# Patient Record
Sex: Female | Born: 1970 | Race: Black or African American | Hispanic: No | Marital: Married | Smoking: Never smoker
Health system: Southern US, Community
[De-identification: ages and names within clinical notes are randomized; demographics above are authoritative.]

## PROBLEM LIST (undated history)

## (undated) DIAGNOSIS — L219 Seborrheic dermatitis, unspecified: Secondary | ICD-10-CM

## (undated) DIAGNOSIS — J301 Allergic rhinitis due to pollen: Secondary | ICD-10-CM

## (undated) DIAGNOSIS — I10 Essential (primary) hypertension: Secondary | ICD-10-CM

## (undated) DIAGNOSIS — E785 Hyperlipidemia, unspecified: Secondary | ICD-10-CM

## (undated) DIAGNOSIS — R768 Other specified abnormal immunological findings in serum: Secondary | ICD-10-CM

## (undated) DIAGNOSIS — O24419 Gestational diabetes mellitus in pregnancy, unspecified control: Secondary | ICD-10-CM

## (undated) HISTORY — PX: UTERINE FIBROID SURGERY: SHX826

## (undated) HISTORY — PX: PILONIDAL CYST EXCISION: SHX744

## (undated) HISTORY — DX: Other specified abnormal immunological findings in serum: R76.8

## (undated) HISTORY — DX: Hyperlipidemia, unspecified: E78.5

## (undated) HISTORY — DX: Allergic rhinitis due to pollen: J30.1

## (undated) HISTORY — DX: Essential (primary) hypertension: I10

## (undated) HISTORY — PX: OTHER SURGICAL HISTORY: SHX169

## (undated) HISTORY — DX: Seborrheic dermatitis, unspecified: L21.9

## (undated) HISTORY — DX: Gestational diabetes mellitus in pregnancy, unspecified control: O24.419

---

## 2002-08-11 ENCOUNTER — Other Ambulatory Visit: Admission: RE | Admit: 2002-08-11 | Discharge: 2002-08-11 | Payer: Self-pay | Admitting: Obstetrics and Gynecology

## 2003-08-03 ENCOUNTER — Encounter: Payer: Self-pay | Admitting: Family Medicine

## 2003-08-03 LAB — CONVERTED CEMR LAB

## 2003-09-04 ENCOUNTER — Ambulatory Visit (HOSPITAL_BASED_OUTPATIENT_CLINIC_OR_DEPARTMENT_OTHER): Admission: RE | Admit: 2003-09-04 | Discharge: 2003-09-04 | Payer: Self-pay | Admitting: *Deleted

## 2003-09-04 ENCOUNTER — Ambulatory Visit (HOSPITAL_COMMUNITY): Admission: RE | Admit: 2003-09-04 | Discharge: 2003-09-04 | Payer: Self-pay | Admitting: *Deleted

## 2003-09-04 ENCOUNTER — Encounter (INDEPENDENT_AMBULATORY_CARE_PROVIDER_SITE_OTHER): Payer: Self-pay | Admitting: Specialist

## 2003-10-01 ENCOUNTER — Other Ambulatory Visit: Admission: RE | Admit: 2003-10-01 | Discharge: 2003-10-01 | Payer: Self-pay | Admitting: Obstetrics and Gynecology

## 2004-01-14 ENCOUNTER — Ambulatory Visit: Payer: Self-pay | Admitting: Family Medicine

## 2004-01-29 ENCOUNTER — Encounter: Admission: RE | Admit: 2004-01-29 | Discharge: 2004-01-29 | Payer: Self-pay | Admitting: Family Medicine

## 2004-02-06 ENCOUNTER — Ambulatory Visit: Payer: Self-pay | Admitting: Family Medicine

## 2004-05-15 ENCOUNTER — Ambulatory Visit: Payer: Self-pay | Admitting: Family Medicine

## 2004-10-15 ENCOUNTER — Ambulatory Visit: Payer: Self-pay | Admitting: Family Medicine

## 2004-10-24 ENCOUNTER — Ambulatory Visit: Payer: Self-pay | Admitting: Family Medicine

## 2004-10-28 ENCOUNTER — Encounter: Admission: RE | Admit: 2004-10-28 | Discharge: 2005-01-26 | Payer: Self-pay | Admitting: Family Medicine

## 2004-10-29 ENCOUNTER — Ambulatory Visit: Payer: Self-pay | Admitting: Family Medicine

## 2004-12-05 ENCOUNTER — Ambulatory Visit: Payer: Self-pay | Admitting: Family Medicine

## 2004-12-23 ENCOUNTER — Inpatient Hospital Stay (HOSPITAL_COMMUNITY): Admission: AD | Admit: 2004-12-23 | Discharge: 2004-12-23 | Payer: Self-pay | Admitting: Obstetrics and Gynecology

## 2005-01-13 ENCOUNTER — Other Ambulatory Visit: Admission: RE | Admit: 2005-01-13 | Discharge: 2005-01-13 | Payer: Self-pay | Admitting: Obstetrics and Gynecology

## 2005-02-25 ENCOUNTER — Ambulatory Visit: Payer: Self-pay | Admitting: Internal Medicine

## 2005-04-09 ENCOUNTER — Ambulatory Visit: Payer: Self-pay | Admitting: Internal Medicine

## 2005-06-12 ENCOUNTER — Ambulatory Visit: Payer: Self-pay | Admitting: Family Medicine

## 2005-06-30 ENCOUNTER — Encounter: Admission: RE | Admit: 2005-06-30 | Discharge: 2005-06-30 | Payer: Self-pay | Admitting: Obstetrics and Gynecology

## 2005-08-14 ENCOUNTER — Inpatient Hospital Stay (HOSPITAL_COMMUNITY): Admission: AD | Admit: 2005-08-14 | Discharge: 2005-08-14 | Payer: Self-pay | Admitting: Obstetrics and Gynecology

## 2005-08-21 ENCOUNTER — Inpatient Hospital Stay (HOSPITAL_COMMUNITY): Admission: RE | Admit: 2005-08-21 | Discharge: 2005-08-25 | Payer: Self-pay | Admitting: Obstetrics & Gynecology

## 2005-08-21 DIAGNOSIS — O24419 Gestational diabetes mellitus in pregnancy, unspecified control: Secondary | ICD-10-CM

## 2005-11-03 ENCOUNTER — Ambulatory Visit: Payer: Self-pay | Admitting: Family Medicine

## 2006-01-21 ENCOUNTER — Ambulatory Visit: Payer: Self-pay | Admitting: Family Medicine

## 2006-10-01 ENCOUNTER — Ambulatory Visit: Payer: Self-pay | Admitting: Family Medicine

## 2006-10-01 ENCOUNTER — Telehealth (INDEPENDENT_AMBULATORY_CARE_PROVIDER_SITE_OTHER): Payer: Self-pay | Admitting: *Deleted

## 2006-10-01 DIAGNOSIS — J329 Chronic sinusitis, unspecified: Secondary | ICD-10-CM

## 2006-10-01 DIAGNOSIS — J309 Allergic rhinitis, unspecified: Secondary | ICD-10-CM | POA: Insufficient documentation

## 2006-10-01 DIAGNOSIS — E785 Hyperlipidemia, unspecified: Secondary | ICD-10-CM

## 2006-10-01 DIAGNOSIS — L659 Nonscarring hair loss, unspecified: Secondary | ICD-10-CM | POA: Insufficient documentation

## 2006-12-16 ENCOUNTER — Encounter: Admission: RE | Admit: 2006-12-16 | Discharge: 2006-12-16 | Payer: Self-pay | Admitting: Obstetrics and Gynecology

## 2007-02-25 ENCOUNTER — Ambulatory Visit: Payer: Self-pay | Admitting: Family Medicine

## 2007-03-05 ENCOUNTER — Ambulatory Visit: Payer: Self-pay | Admitting: Family Medicine

## 2007-08-30 ENCOUNTER — Inpatient Hospital Stay (HOSPITAL_COMMUNITY): Admission: AD | Admit: 2007-08-30 | Discharge: 2007-08-30 | Payer: Self-pay | Admitting: Obstetrics and Gynecology

## 2007-09-15 ENCOUNTER — Inpatient Hospital Stay (HOSPITAL_COMMUNITY): Admission: RE | Admit: 2007-09-15 | Discharge: 2007-09-18 | Payer: Self-pay | Admitting: Obstetrics and Gynecology

## 2007-09-15 DIAGNOSIS — O24419 Gestational diabetes mellitus in pregnancy, unspecified control: Secondary | ICD-10-CM

## 2007-11-01 ENCOUNTER — Ambulatory Visit: Payer: Self-pay | Admitting: Family Medicine

## 2007-11-28 ENCOUNTER — Ambulatory Visit: Payer: Self-pay | Admitting: Family Medicine

## 2008-02-12 ENCOUNTER — Emergency Department (HOSPITAL_COMMUNITY): Admission: EM | Admit: 2008-02-12 | Discharge: 2008-02-13 | Payer: Self-pay | Admitting: Emergency Medicine

## 2008-02-12 ENCOUNTER — Encounter: Payer: Self-pay | Admitting: Family Medicine

## 2008-02-15 ENCOUNTER — Ambulatory Visit: Payer: Self-pay | Admitting: Family Medicine

## 2008-02-15 ENCOUNTER — Telehealth (INDEPENDENT_AMBULATORY_CARE_PROVIDER_SITE_OTHER): Payer: Self-pay | Admitting: *Deleted

## 2008-02-15 DIAGNOSIS — R739 Hyperglycemia, unspecified: Secondary | ICD-10-CM

## 2008-02-15 DIAGNOSIS — Z8742 Personal history of other diseases of the female genital tract: Secondary | ICD-10-CM

## 2008-02-15 LAB — CONVERTED CEMR LAB
Bilirubin Urine: NEGATIVE
Glucose, Urine, Semiquant: NEGATIVE
Ketones, urine, test strip: NEGATIVE
Nitrite: NEGATIVE
Specific Gravity, Urine: <1.005
Urobilinogen, UA: 0.2
pH: 7

## 2008-02-16 ENCOUNTER — Encounter: Payer: Self-pay | Admitting: Family Medicine

## 2008-02-16 LAB — CONVERTED CEMR LAB
Glucose, Bld: 84 mg/dL (ref 70–99)
Hgb A1c MFr Bld: 5.8 % (ref 4.6–6.0)

## 2008-03-19 ENCOUNTER — Telehealth: Payer: Self-pay | Admitting: Family Medicine

## 2008-03-21 ENCOUNTER — Ambulatory Visit: Payer: Self-pay | Admitting: Family Medicine

## 2008-04-25 ENCOUNTER — Ambulatory Visit: Payer: Self-pay | Admitting: Family Medicine

## 2008-06-06 ENCOUNTER — Ambulatory Visit: Payer: Self-pay | Admitting: Family Medicine

## 2008-06-22 ENCOUNTER — Ambulatory Visit: Payer: Self-pay | Admitting: Family Medicine

## 2008-06-22 DIAGNOSIS — G43009 Migraine without aura, not intractable, without status migrainosus: Secondary | ICD-10-CM | POA: Insufficient documentation

## 2008-12-10 ENCOUNTER — Ambulatory Visit: Payer: Self-pay | Admitting: Family Medicine

## 2009-01-23 ENCOUNTER — Ambulatory Visit: Payer: Self-pay | Admitting: Family Medicine

## 2009-01-28 LAB — CONVERTED CEMR LAB
ALT: 10 units/L (ref 0–35)
AST: 17 units/L (ref 0–37)
Albumin: 3.8 g/dL (ref 3.5–5.2)
Alkaline Phosphatase: 57 units/L (ref 39–117)
BUN: 10 mg/dL (ref 6–23)
Basophils Absolute: 0 10*3/uL (ref 0.0–0.1)
Basophils Relative: 0.3 % (ref 0.0–3.0)
Bilirubin, Direct: 0 mg/dL (ref 0.0–0.3)
CO2: 26 meq/L (ref 19–32)
Calcium: 8.1 mg/dL — ABNORMAL LOW (ref 8.4–10.5)
Chloride: 104 meq/L (ref 96–112)
Cholesterol: 166 mg/dL (ref 0–200)
Creatinine, Ser: 0.8 mg/dL (ref 0.4–1.2)
Eosinophils Absolute: 0.2 10*3/uL (ref 0.0–0.7)
Eosinophils Relative: 3.2 % (ref 0.0–5.0)
GFR calc non Af Amer: 102.2 mL/min (ref >60)
Glucose, Bld: 107 mg/dL — ABNORMAL HIGH (ref 70–99)
HCT: 38.4 % (ref 36.0–46.0)
HDL: 51 mg/dL (ref >39.00)
Hemoglobin: 12.9 g/dL (ref 12.0–15.0)
LDL Cholesterol: 99 mg/dL (ref 0–99)
Lymphocytes Relative: 23.7 % (ref 12.0–46.0)
Lymphs Abs: 1.1 10*3/uL (ref 0.7–4.0)
MCHC: 33.4 g/dL (ref 30.0–36.0)
MCV: 80.9 fL (ref 78.0–100.0)
Monocytes Absolute: 0.6 10*3/uL (ref 0.1–1.0)
Monocytes Relative: 13.4 % — ABNORMAL HIGH (ref 3.0–12.0)
Neutro Abs: 2.8 10*3/uL (ref 1.4–7.7)
Neutrophils Relative %: 59.4 % (ref 43.0–77.0)
Platelets: 202 10*3/uL (ref 150.0–400.0)
Potassium: 3.6 meq/L (ref 3.5–5.1)
RBC: 4.76 M/uL (ref 3.87–5.11)
RDW: 14 % (ref 11.5–14.6)
Sodium: 137 meq/L (ref 135–145)
TSH: 0.63 microintl units/mL (ref 0.35–5.50)
Total Bilirubin: 0.8 mg/dL (ref 0.3–1.2)
Total CHOL/HDL Ratio: 3
Total Protein: 7.1 g/dL (ref 6.0–8.3)
Triglycerides: 82 mg/dL (ref 0.0–149.0)
VLDL: 16.4 mg/dL (ref 0.0–40.0)
WBC: 4.7 10*3/uL (ref 4.5–10.5)

## 2009-02-22 ENCOUNTER — Telehealth: Payer: Self-pay | Admitting: Family Medicine

## 2009-03-25 ENCOUNTER — Ambulatory Visit: Payer: Self-pay | Admitting: Family Medicine

## 2009-03-29 LAB — CONVERTED CEMR LAB
Calcium, Total (PTH): 8.8 mg/dL (ref 8.4–10.5)
PTH: 38.3 pg/mL (ref 14.0–72.0)

## 2009-05-13 ENCOUNTER — Telehealth: Payer: Self-pay | Admitting: Family Medicine

## 2009-05-13 ENCOUNTER — Ambulatory Visit: Payer: Self-pay | Admitting: Family Medicine

## 2009-05-28 ENCOUNTER — Telehealth: Payer: Self-pay | Admitting: Family Medicine

## 2009-07-02 ENCOUNTER — Ambulatory Visit: Payer: Self-pay | Admitting: Family Medicine

## 2009-07-02 LAB — CONVERTED CEMR LAB: Rapid Strep: NEGATIVE

## 2009-07-03 ENCOUNTER — Telehealth: Payer: Self-pay | Admitting: Family Medicine

## 2009-07-15 ENCOUNTER — Telehealth: Payer: Self-pay | Admitting: Family Medicine

## 2009-10-03 ENCOUNTER — Ambulatory Visit: Payer: Self-pay | Admitting: Family Medicine

## 2010-01-02 ENCOUNTER — Encounter: Admission: RE | Admit: 2010-01-02 | Discharge: 2010-01-02 | Payer: Self-pay | Admitting: Obstetrics and Gynecology

## 2010-01-16 ENCOUNTER — Ambulatory Visit: Payer: Self-pay | Admitting: Internal Medicine

## 2010-01-28 ENCOUNTER — Ambulatory Visit: Payer: Self-pay | Admitting: Family Medicine

## 2010-01-30 ENCOUNTER — Ambulatory Visit: Payer: Self-pay | Admitting: Family Medicine

## 2010-02-19 ENCOUNTER — Encounter: Payer: Self-pay | Admitting: Family Medicine

## 2010-02-23 ENCOUNTER — Encounter: Payer: Self-pay | Admitting: Obstetrics and Gynecology

## 2010-03-03 ENCOUNTER — Ambulatory Visit
Admission: RE | Admit: 2010-03-03 | Discharge: 2010-03-03 | Payer: Self-pay | Source: Home / Self Care | Attending: Family Medicine | Admitting: Family Medicine

## 2010-03-03 DIAGNOSIS — M6740 Ganglion, unspecified site: Secondary | ICD-10-CM | POA: Insufficient documentation

## 2010-03-04 NOTE — Assessment & Plan Note (Signed)
Summary: SORE THROAT   Vital Signs:  Patient profile:   40 year old female Height:      63 inches Weight:      150.50 pounds BMI:     26.76 Temp:     98.7 degrees F oral Pulse rate:   84 / minute Pulse rhythm:   regular BP sitting:   142 / 90  (left arm) Cuff size:   regular  Vitals Entered By: Delilah Shan CMA Duncan Dull) (Jul 02, 2009 2:29 PM) CC: ST   History of Present Illness: starting to get sick on saturday is painful in ears - with severe sore throat also some hoarseness  taking some aleve otc for pain  still hurts to swallow  has not looked at it   100.0 fever this am feeling fatigue and some chills  no rash  no tick bites   some runny and stuffy nose and cough  yellow sinus drainage with blood in it  ? if sinus infx again- has them chronically  not too much sinus pressure   had some nausea last night - that is improve --phenergan worked  no vomiting  no chance pregnant   Allergies: 1)  ! Imitrex Statdose System (Sumatriptan Succinate)  Past History:  Past Medical History: Last updated: 06/08/09 Allergic rhinitis Hyperlipidemia gestational DM  ? pos hep B test  migraine   Past Surgical History: Last updated: 10/01/2006 Pilonidal cyst (02/2003),  surgery (09/2003) Uterine fibroids Hystosalpingogram- neg 07-09-2004)  Family History: Last updated: 06/08/2009 Father: DM  Mother: died of liver cancer 07-09-2008 Siblings: 3 sisters sister - headaches   Social History: Last updated: 02/15/2008 Marital Status: Married Children:  Occupation:  children- 2   Risk Factors: Smoking Status: never (10/01/2006)  Review of Systems General:  Complains of chills, fatigue, and fever. Eyes:  Denies blurring and discharge. CV:  Denies chest pain or discomfort and lightheadness. Resp:  Complains of cough and sputum productive; denies pleuritic and shortness of breath. GI:  Denies abdominal pain, indigestion, nausea, and vomiting. Derm:  Denies itching and  rash. Allergy:  Complains of seasonal allergies.  Physical Exam  General:  Well-developed,well-nourished,in no acute distress; alert,appropriate and cooperative throughout examination Head:  normocephalic, atraumatic, and no abnormalities observed.  no sinus tenderness  Eyes:  vision grossly intact, pupils equal, pupils round, pupils reactive to light, and no injection.   Ears:  R ear normal and L ear normal.   Nose:  nares are injected and congested bilaterally  Mouth:  pharynx pink and moist.  throat is mildly injected with clear post nasal drip no swelling or exudate  Neck:  No deformities, masses, or tenderness noted. Lungs:  Normal respiratory effort, chest expands symmetrically. Lungs are clear to auscultation, no crackles or wheezes. Heart:  Normal rate and regular rhythm. S1 and S2 normal without gallop, murmur, click, rub or other extra sounds. Skin:  Intact without suspicious lesions or rashes Cervical Nodes:  No lymphadenopathy noted Psych:  normal affect, talkative and pleasant    Impression & Recommendations:  Problem # 1:  URI (ICD-465.9) Assessment New  with sore throat and congestion and neg rapid strep  recommend sympt care- see pt instructions   in light of hx of chronic sinusitis - px for augmentin to hold in case she worsens or does not improve in several days  pt advised to update me if symptoms worsen or do not improve -- or if worse st Her updated medication list for this problem includes:  Promethazine Hcl 25 Mg Tabs (Promethazine hcl) .Marland Kitchen... 1 by mouth up to three times a day as needed nausea /vomiting  caution of sedation    Tylenol Allergy Sinus 2-30-500 Mg Tabs (Chlorphen-pseudoephed-apap) ..... Otc as directed.  Orders: Rapid Strep ([address])  Complete Medication List: 1)  Flonase 50 Mcg/act Susp (Fluticasone propionate) .... 2 sprays in each nostril daily as needed 2)  Promethazine Hcl 25 Mg Tabs (Promethazine hcl) .Marland Kitchen.. 1 by mouth up to three times a  day as needed nausea /vomiting  caution of sedation 3)  Tylenol Allergy Sinus 2-30-500 Mg Tabs (Chlorphen-pseudoephed-apap) .... Otc as directed. 4)  Augmentin 875-125 Mg Tabs (Amoxicillin-pot clavulanate) .Marland Kitchen.. 1 by mouth two times a day for 10 days  Patient Instructions: 1)  rapid strep test is negative  2)  I think you have upper resp virus  3)  drink lots of fluids  4)  try some zyrtec over the counter 10mg  daily - to slow down runny nose and drip  5)  naproxen is ok for throat pain and fever - but take it with food  6)  chloraseptic throat spray helps too  7)  if worse sore throat or sinus pain- fill the px for augmentin 8)  if not improving in5-7 days - please update me (or if you get worse )  Prescriptions: AUGMENTIN 875-125 MG TABS (AMOXICILLIN-POT CLAVULANATE) 1 by mouth two times a day for 10 days  #20 x 0   Entered and Authorized by:   Judith Part MD   Signed by:   Judith Part MD on 07/02/2009   Method used:   Print then Give to Patient   RxID:   [id number]   Current Allergies (reviewed today): ! IMITREX STATDOSE SYSTEM (SUMATRIPTAN SUCCINATE)  Laboratory Results  Date/Time Received: Jul 02, 2009 2:32 PM   Other Tests  Rapid Strep: negative

## 2010-03-04 NOTE — Progress Notes (Signed)
Summary: requests muscle relaxer  Phone Note Call from Patient Call back at [phone]   Caller: Patient Call For: Judith Part MD Summary of Call: Pt was here earlier and was told a script for muscle relaxer would be called in, but it hasnt.  She wants this one sent to [facility]. Initial call taken by: Lowella Petties CMA,  May 13, 2009 12:41 PM  Follow-up for Phone Call        please call in flexeril 10 mg  1 by mouth up to three times a day as needed headache #30 0 ref - thanks  Follow-up by: Judith Part MD,  May 13, 2009 12:55 PM  Additional Follow-up for Phone Call Additional follow up Details #1::        [facility] as instructed. Unable to reach pt by phone  no answer and no v/m Will try later. Lewanda Rife LPN  May 13, 2009 1:22 PM   Left message for patient to call back. Lewanda Rife LPN  May 13, 2009 3:02 PM   Patient notified as instructed by telephone. Lewanda Rife LPN  May 13, 2009 4:45 PM

## 2010-03-04 NOTE — Assessment & Plan Note (Signed)
Summary: RASH   Vital Signs:  Patient profile:   40 year old female Height:      63 inches Weight:      152.25 pounds BMI:     27.07 Temp:     98.5 degrees F oral Pulse rate:   88 / minute Pulse rhythm:   regular BP sitting:   142 / 92  (left arm) Cuff size:   regular  Vitals Entered By: Delilah Shan CMA Duncan Dull) (October 03, 2009 3:40 PM) CC: Rash on stomach., Depression   History of Present Illness: Itching last night.  Rash noted last night.  No known triggers.  No new foods, soaps, detergents, etc.  No other people with similar symptoms.  Prev travel to [place] until yesterday. cortisone helps some.  On bilateral ant abdomen   Current Medications (verified): 1)  Flonase 50 Mcg/act Susp (Fluticasone Propionate) .... 2 Sprays in Each Nostril Daily As Needed 2)  Promethazine Hcl 25 Mg Tabs (Promethazine Hcl) .Marland Kitchen.. 1 By Mouth Up To Three Times A Day As Needed Nausea /vomiting  Caution of Sedation 3)  Tylenol Allergy Sinus 2-30-500 Mg Tabs (Chlorphen-Pseudoephed-Apap) .... Otc As Directed. 4)  Multivitamins   Tabs (Multiple Vitamin) .... Take 1 Tablet By Mouth Once A Day  Allergies: 1)  ! 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  Otherwise negative.    Physical Exam  General:  A&O NAD RRR CTAB, no increase in WOB Skin with bilateral lower abdominal rash in midaxillary line, L>R with blanching erythema, maculopaplar eruption   Impression & Recommendations:  Problem # 1:  RASH-NONVESICULAR (ICD-782.1) d/w patient, no clear source.  tx symptoms at this point and follow up as needed.  she agrees.  Her updated medication list for this problem includes:    Triamcinolone Acetonide 0.1 % Crea (Triamcinolone acetonide) .Marland Kitchen... Aaa three times a day as needed for itching  Complete Medication List: 1)  Flonase 50 Mcg/act  Susp (Fluticasone propionate) .... 2 sprays in each nostril daily as needed 2)  Promethazine Hcl 25 Mg Tabs (Promethazine hcl) .Marland Kitchen.. 1 by mouth up to three times a day as needed nausea /vomiting  caution of sedation 3)  Tylenol Allergy Sinus 2-30-500 Mg Tabs (Chlorphen-pseudoephed-apap) .... Otc as directed. 4)  Multivitamins Tabs (Multiple vitamin) .... Take 1 tablet by mouth once a day 5)  Triamcinolone Acetonide 0.1 % Crea (Triamcinolone acetonide) .... Aaa three times a day as needed for itching  Patient Instructions: 1)  Use the cream three times a day as needed and take claritin 10mg  a day for itching.  Let us know if it doesn't get better.   Prescriptions: TRIAMCINOLONE ACETONIDE 0.1 % CREA (TRIAMCINOLONE ACETONIDE) AAA three times a day as needed for itching  #15g x 1   Entered and Authorized by:   Crawford Givens MD   Signed by:   Crawford Givens MD on 10/03/2009   Method used:   [facility]        [facility] (retail)       [address]       [place], [place]  [address]       Ph: [phone] or [phone]       Fax: [phone]   RxID:   [id number]   Current Allergies (reviewed today): ! IMITREX STATDOSE SYSTEM (SUMATRIPTAN SUCCINATE)

## 2010-03-04 NOTE — Assessment & Plan Note (Signed)
Summary: MIGRAINE HA/CLE   Vital Signs:  Patient profile:   40 year old female Height:      63 inches Weight:      146.25 pounds BMI:     26.00 Temp:     99.1 degrees F oral Pulse rate:   84 / minute Pulse rhythm:   regular BP sitting:   142 / 90  (left arm) Cuff size:   regular  Vitals Entered By: Lewanda Rife LPN (2009/06/04 10:58 AM) CC: migraine h/a   History of Present Illness: headache for past 3-4 days  tried imitrex and it did not take away headache and made her nauseated tried allegra and ibuprofen tylenol sinus pressure med   headaches i throbbing over her L eye  some pressure in face and neck and shoulder this weekend on L also  some days has nausea- not severe (one day out of 7 )  no vision change  sometimes severe - and cannot fxn -- up to 9/10- and has to lie down the whole weekend not due for menses - just finished a week ago  is having sensitivity to light and sound - and gets irritable   in general is getting headaches 5 days out of week -- do not completely go away for a day  does drink plenty of water   sister has similar headahes seasonally  some facial pressure -- with congestion at night - the otc are helping that  does not feel like a sinus infection  no colored d/c or fever at home -- temp here is 99  has spring allergies as well  is not using her flonase   has always had some sinus headaches - past 6-12 months having more often  she just finished her camilla pill- thought it was making her depressed no worries about pregnancy   Allergies (verified): 1)  ! Imitrex Statdose System (Sumatriptan Succinate)  Past History:  Past Surgical History: Last updated: 10/01/2006 Pilonidal cyst (02/2003),  surgery (09/2003) Uterine fibroids Hystosalpingogram- neg Jul 06, 2004)  Family History: Last updated: 06/04/09 Father: DM  Mother: died of liver cancer 06-Jul-2008 Siblings: 3 sisters sister - headaches   Social History: Last updated:  02/15/2008 Marital Status: Married Children:  Occupation:  children- 2   Risk Factors: Smoking Status: never (10/01/2006)  Past Medical History: Allergic rhinitis Hyperlipidemia gestational DM  ? pos hep B test  migraine   Family History: Father: DM  Mother: died of liver cancer 07-06-2008 Siblings: 3 sisters sister - headaches   Review of Systems General:  Denies chills, fever, loss of appetite, and malaise. Eyes:  Denies blurring and eye irritation. ENT:  Complains of sinus pressure; denies sore throat. CV:  Denies chest pain or discomfort and lightheadness. Resp:  Denies cough and wheezing. GI:  Complains of nausea; denies abdominal pain, change in bowel habits, and vomiting. MS:  Denies low back pain and mid back pain. Derm:  Denies itching and rash. Neuro:  Complains of headaches; denies brief paralysis, memory loss, numbness, tingling, tremors, visual disturbances, and weakness. Psych:  Denies anxiety and depression. Endo:  Denies cold intolerance, excessive thirst, excessive urination, and heat intolerance. Heme:  Denies abnormal bruising and bleeding.  Physical Exam  General:  Well-developed,well-nourished,in no acute distress; alert,appropriate and cooperative throughout examination Head:  normocephalic, atraumatic, and no abnormalities observed.  no sinus or temporal tenderness  Eyes:   no nystagmus fundi grossly wnlvision grossly intact, pupils equal, pupils round, and pupils reactive to light.  Ears:  R ear normal and L ear normal.   Nose:  no nasal discharge.   Mouth:  pharynx pink and moist.   Neck:  supple with full rom and no masses or thyromegally, no JVD or carotid bruit  Chest Wall:  No deformities, masses, or tenderness noted. Lungs:  Normal respiratory effort, chest expands symmetrically. Lungs are clear to auscultation, no crackles or wheezes. Heart:  Normal rate and regular rhythm. S1 and S2 normal without gallop, murmur, click, rub or other extra  sounds. Msk:  No deformity or scoliosis noted of thoracic or lumbar spine.   Extremities:  No clubbing, cyanosis, edema, or deformity noted with normal full range of motion of all joints.   Neurologic:  alert & oriented X3, cranial nerves II-XII intact, strength normal in all extremities, sensation intact to light touch, gait normal, DTRs symmetrical and normal, heel-to-shin normal, toes down bilaterally on Babinski, and Romberg negative.   Skin:  Intact without suspicious lesions or rashes Cervical Nodes:  No lymphadenopathy noted Psych:  normal affect, talkative and pleasant    Impression & Recommendations:  Problem # 1:  COMMON MIGRAINE (ICD-346.10) Assessment Deteriorated pt sufffers from chronic daily headache - worse with recent allergy exac and also neck pain-- suspect multifactorial disc triggers to avoid is intol to tryptan on first try disc med overuse  px flexeril to try ref to headache clinic  The following medications were removed from the medication list:    Imitrex 100 Mg Tabs (Sumatriptan succinate) .Marland Kitchen... 1 by mouth times one as needed migraine  Orders: Headache Clinic Referral (Headache) Prescription Created Electronically [phone])  Problem # 2:  ALLERGIC RHINITIS (ICD-477.9) Assessment: Deteriorated  worse seasonally- px flonase to get back on and update disc allergen avoidance disc role in headaches as well  Her updated medication list for this problem includes:    Flonase 50 Mcg/act Susp (Fluticasone propionate) .Marland Kitchen... 2 sprays in each nostril daily as needed    Promethazine Hcl 25 Mg Tabs (Promethazine hcl) .Marland Kitchen... 1 by mouth up to three times a day as needed nausea /vomiting  caution of sedation  Orders: Prescription Created Electronically [phone])  Complete Medication List: 1)  Flonase 50 Mcg/act Susp (Fluticasone propionate) .... 2 sprays in each nostril daily as needed 2)  Promethazine Hcl 25 Mg Tabs (Promethazine hcl) .Marland Kitchen.. 1 by mouth up to three times a  day as needed nausea /vomiting  caution of sedation 3)  Tylenol Allergy Sinus 2-30-500 Mg Tabs (Chlorphen-pseudoephed-apap) .... Otc as directed.  Patient Instructions: 1)  get back on flonase every day- I sent to your pharmacy  2)  try flexeril as needed for headache- it is a muscle relaxer and may make you sleepy  3)  we will do headache center referral at check out  Prescriptions: FLONASE 50 MCG/ACT SUSP (FLUTICASONE PROPIONATE) 2 sprays in each nostril daily as needed  #1 mdi x 11   Entered and Authorized by:   Judith Part MD   Signed by:   Judith Part MD on 05/13/2009   Method used:   Electronically to        [facility] (retail)       [address]       [place], [place]  [address]       Ph: [phone] or [phone]       Fax: [phone]   RxID:   [id number]   Prior Medications (reviewed today): FLONASE 50 MCG/ACT SUSP (FLUTICASONE PROPIONATE) 2  sprays in each nostril daily as needed PROMETHAZINE HCL 25 MG TABS (PROMETHAZINE HCL) 1 by mouth up to three times a day as needed nausea /vomiting  caution of sedation TYLENOL ALLERGY SINUS 2-30-500 MG TABS (CHLORPHEN-PSEUDOEPHED-APAP) OTC As directed. Current Allergies (reviewed today): ! IMITREX STATDOSE SYSTEM (SUMATRIPTAN SUCCINATE)

## 2010-03-04 NOTE — Progress Notes (Signed)
Summary: Rx for migraines  Phone Note Call from Patient Call back at [phone]   Caller: Patient Call For: Judith Part MD Summary of Call: Patient called and left a message on voicemail stating that she needs a Rx called in for migraines.  She said that she was given a Rx in the past by Dr. Milinda Antis but never got it filled.  Has tried using OTC medications for the migraines and it is not helping.  [phone] Initial call taken by: Linde Gillis CMA Duncan Dull),  February 22, 2009 11:05 AM  Follow-up for Phone Call        px written on EMR for call in for imitrex let me know if not improved  Follow-up by: Judith Part MD,  February 22, 2009 1:25 PM  Additional Follow-up for Phone Call Additional follow up Details #1::        [facility] pt. Additional Follow-up by: Lowella Petties CMA,  February 22, 2009 2:45 PM    New/Updated Medications: IMITREX 100 MG TABS (SUMATRIPTAN SUCCINATE) 1 by mouth times one as needed migraine Prescriptions: IMITREX 100 MG TABS (SUMATRIPTAN SUCCINATE) 1 by mouth times one as needed migraine  #9 x 0   Entered and Authorized by:   Judith Part MD   Signed by:   Lowella Petties CMA on 02/22/2009   Method used:   Telephoned to ...       [facility] [address] [facility] [address] (retail)       [address] [place], [place]  [address]       Ph: [phone]       Fax: [phone]   RxID:   [id number]

## 2010-03-04 NOTE — Progress Notes (Signed)
Summary: pt requests antibiotic  Phone Note Call from Patient Call back at [phone]   Caller: Patient Call For: Judith Part MD Summary of Call: Pt was seen a couple of weeks ago with some sinus sxs.  She told you she didnt think she had a sinus infection, but now she thinks she does.  She is having some blood tinged yellow drainage, pressure, headache.  She is requesting that an abx be called to [facility] [address] in [place]. Initial call taken by: Lowella Petties CMA,  May 28, 2009 12:35 PM  Follow-up for Phone Call        that does sound worse to me px written on EMR for call in - augmentin f/u if not imp in 10-14 days- Follow-up by: Judith Part MD,  May 28, 2009 1:11 PM  Additional Follow-up for Phone Call Additional follow up Details #1::        Pt prefers to have a z-pack instead.  She said she will forget to take something after about 5 days.  I do not think z pak works as well for sinusitis-- see if she insists on it and update me  Additional Follow-up by: Lowella Petties CMA,  May 28, 2009 2:17 PM    Additional Follow-up for Phone Call Additional follow up Details #2::    Advised pt, she agreed to try augmentin.  [facility] Follow-up by: Lowella Petties CMA,  May 28, 2009 3:56 PM  New/Updated Medications: AUGMENTIN 875-125 MG TABS (AMOXICILLIN-POT CLAVULANATE) 1 by mouth two times a day with food for 10 days for sinus infection Prescriptions: AUGMENTIN 875-125 MG TABS (AMOXICILLIN-POT CLAVULANATE) 1 by mouth two times a day with food for 10 days for sinus infection  #20 x 0   Entered and Authorized by:   Judith Part MD   Signed by:   Judith Part MD on 05/28/2009   Method used:   Telephoned to ...       [facility] (retail)       [address]       [place], [place]  [address]       Ph: [phone] or [phone]       Fax: [phone]   RxID:   [id number]

## 2010-03-04 NOTE — Progress Notes (Signed)
Summary: naproxen not helping throat  Phone Note Call from Patient Call back at [phone]   Caller: Patient Call For: Judith Part MD Summary of Call: Pt was seen yesterday for sore throat and says the naproxen she is taking for her throat pain isnt helping at all.  She is asking if something else would work better, or if something can be called in.  [facility] [address].               Lowella Petties CMA  July 03, 2009 9:22 AM   Follow-up for Phone Call        can call in med with codiene if very careful of sedation go ahead also and fill augmentin and take it  please call in tylenol #3   1 by mouth up to every 4 hours as needed severe pain #15 no ref  Follow-up by: Judith Part MD,  July 03, 2009 9:24 AM  Additional Follow-up for Phone Call Additional follow up Details #1::        Patient Advised. Medication phoned to pharmacy.  Additional Follow-up by: Delilah Shan CMA (AAMA),  July 03, 2009 9:42 AM

## 2010-03-04 NOTE — Progress Notes (Signed)
Summary: not any better  Phone Note Call from Patient Call back at [phone]   Caller: Patient Call For: Judith Part MD Summary of Call: Pt was seen 2 weeks ago for sore throat and sinus drainage.  She was given augmentin, but didnt take it.  She is not any better and is asking if she should take an antibiotic now.  She still has the same sxs.  She is asking if she can have a z- pack instead of augmentin.  [facility]. Initial call taken by: Lowella Petties CMA,  July 15, 2009 12:09 PM  Follow-up for Phone Call        I prefer the augmentin for coverage of sinus bacteria - but if she is intolerant or allergic to this - let me know please go ahead and take it  Follow-up by: Judith Part MD,  July 15, 2009 1:18 PM  Additional Follow-up for Phone Call Additional follow up Details #1::        Patient notified as instructed by telephone. Pt is not allergic to Augmentin but she said it takes too long to work and she would like Zpack because she has taken it before and the next day after starting Zpak she felt so much better. Please advise. Lewanda Rife LPN  July 15, 2009 2:56 PM     Additional Follow-up for Phone Call Additional follow up Details #2::    cannot promise those results again - but will change to zpak f/u if not imp Follow-up by: Judith Part MD,  July 15, 2009 3:10 PM  Additional Follow-up for Phone Call Additional follow up Details #3:: Details for Additional Follow-up Action Taken: Called to [facility], pt ok with that. Additional Follow-up by: Lowella Petties CMA,  July 15, 2009 5:05 PM  New/Updated Medications: ZITHROMAX Z-PAK 250 MG TABS (AZITHROMYCIN) take by mouth as directed Prescriptions: ZITHROMAX Z-PAK 250 MG TABS (AZITHROMYCIN) take by mouth as directed  #1 pack x 0   Entered and Authorized by:   Judith Part MD   Signed by:   Lewanda Rife LPN on 75/64/3329   Method used:   Telephoned to ...       [facility]  [address]  [phone]* (retail)       [address]       [place], [place]  [id number]       Ph: [phone] or [phone]       Fax: [phone]   RxID:   [id number]

## 2010-03-06 NOTE — Assessment & Plan Note (Signed)
Summary: ALLERGIES/CLE   Vital Signs:  Patient profile:   40 year old female Weight:      152 pounds Temp:     98.7 degrees F oral Pulse rate:   84 / minute Pulse rhythm:   regular BP sitting:   132 / 74  (left arm) Cuff size:   regular  Vitals Entered By: [name] CMA Duncan Dull) (January 16, 2010 8:17 AM) CC: Allergies   History of Present Illness: CC: allergies  has had allergies on and off all life, recently worse.  had allergy tests - allergic to dust mites etc.  No food allergies.  Last few months noticing worse.  Mainly PND and throat irritation (which shuts her down).  Seems to get sick every other week.  + RN, congestion, itchy eyes which is manageable.  Tried claritin, allegra PRN, cough medicine.  Flonase nose spray daily last 2 months.  Nothing helps. + cough from PND, dry.  PND worse in afternoon and at night.  Hasn't tried other nasal steroid.  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  Pt without fever, aches.  not significant sinus pressure/ear pain/tooth pain.  No smokers at home.  Current Medications (verified): 1)  Flonase 50 Mcg/act Susp (Fluticasone Propionate) .... 2 Sprays in Each Nostril Daily As Needed 2)  Promethazine Hcl 25 Mg Tabs (Promethazine Hcl) .Marland Kitchen.. 1 By Mouth Up To Three Times A Day As Needed Nausea /vomiting  Caution of Sedation 3)  Tylenol Allergy Sinus 2-30-500 Mg Tabs (Chlorphen-Pseudoephed-Apap) .... Otc As Directed. 4)  Multivitamins   Tabs (Multiple Vitamin) .... Take 1 Tablet By Mouth Once A Day  Allergies: 1)  ! Imitrex Statdose System  Past History:  Past Medical History: Last updated: 05/13/2009 Allergic rhinitis Hyperlipidemia gestational DM  ? 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  no sinus tenderness. Eyes:  vision grossly intact, pupils equal, pupils round, pupils reactive to light, and no injection. Ears:  R ear normal and L ear normal. Nose:  pale and boggy turbinates bilaterally. Mouth:  pharynx pink and moist.  throat is mildly injected with clear post nasal drip no swelling or exudate Neck:  No deformities, masses, or tenderness noted. Lungs:  Normal respiratory effort, chest expands symmetrically. Lungs are clear to auscultation, no crackles or wheezes. Heart:  Normal rate and regular rhythm. S1 and S2 normal without gallop, murmur, click, rub or other extra sounds. Pulses:  2+ rad pulses Extremities:  No clubbing, cyanosis, edema, or deformity noted with normal full range of motion of all joints.   Skin:  Intact without suspicious lesions or rashes   Impression & Recommendations:  Problem # 1:  ALLERGIC RHINITIS (ICD-477.9) Assessment Deteriorated with significant PND component.  All other sxs are tolerable.  not significant nasal congestion/sinus pressure to make me think sinusitis although could consider treating empirically to see if improved given going on for 3 wks vs referral to allergy.  Trial of another intranasal steroid.  If not better, call with update.  rec make CPE appt with Dr. Milinda Antis.   did not do singulair because not significant congestion component.  rec nasal  saline as well.  The following medications were removed from the medication list:    Promethazine Hcl 25 Mg Tabs (Promethazine hcl) .Marland Kitchen... 1 by mouth up to three times a day as needed nausea /vomiting  caution of sedation Her updated medication list for this problem includes:    Nasonex 50 Mcg/act Susp (Mometasone furoate) .Marland Kitchen... 2 sprays in each nostril daily  Complete Medication List: 1)  Nasonex 50 Mcg/act Susp  (Mometasone furoate) .... 2 sprays in each nostril daily 2)  Tylenol Allergy Sinus 2-30-500 Mg Tabs (Chlorphen-pseudoephed-apap) .... Otc as directed. 3)  Multivitamins Tabs (Multiple vitamin) .... Take 1 tablet by mouth once a day  Patient Instructions: 1)  schedule physcial with Dr. Milinda Antis. 2)  Try other nasal steroid (nasonex) daily for 2 wks to see if notice improvement.  If not, give [place] a call. 3)  Nasal saline while at work. 4)  continue antihistamine daily (zyrtec, allegra or claritin). 5)  Good to see you today, call clinic with questions. Prescriptions: NASONEX 50 MCG/ACT SUSP (MOMETASONE FUROATE) 2 sprays in each nostril daily  #1 x 3   Entered and Authorized by:   Eustaquio Boyden  MD   Signed by:   Eustaquio Boyden  MD on 01/16/2010   Method used:   Print then Give to Patient   RxID:   [id number]    Orders Added: 1)  Est. Patient Level III [14782]    Current Allergies (reviewed today): ! IMITREX STATDOSE SYSTEM

## 2010-03-12 NOTE — Assessment & Plan Note (Signed)
Summary: LEFT HAND,WRIST PAIN/CLE   Vital Signs:  Patient profile:   40 year old female Weight:      152.25 pounds Temp:     98.5 degrees F oral Pulse rate:   88 / minute Pulse rhythm:   regular BP sitting:   138 / 90  (left arm) Cuff size:   regular  Vitals Entered By: [name] CMA (AAMA) (March 03, 2010 2:22 PM) CC: Left hand and wrist pain x4 days   History of Present Illness: CC: L hand/wrist pain  5-6 d h/o L hand pain.  Has bothered her in past.  This is longest it's hurt.  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  Hasn't tried anything so far x wrist brace which she wears at night and does help some.  No fevers/chills, no other joint or muscle pain, no new rashes.    h/o bursitis L shoulder last year s/p steroid injection at some [facility].  Current Medications (verified): 1)  Nasonex 50 Mcg/act Susp (Mometasone Furoate) .... 2 Sprays in Each Nostril Daily 2)  Tylenol Allergy Sinus 2-30-500 Mg Tabs (Chlorphen-Pseudoephed-Apap) .... Otc As Directed. 3)  Multivitamins   Tabs (Multiple Vitamin) .... Take 1 Tablet By Mouth Once A Day 4)  Allegra Allergy 180 Mg Tabs (Fexofenadine Hcl) .... One Daily  Allergies (verified): No Known Drug Allergies  Past History:  Past Medical History: Last updated: 05/13/2009 Allergic rhinitis Hyperlipidemia gestational DM  ? 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  + tender to palpation.  FROM at wrist bilaterally as well as no snuff box tenderness.  negative finkelstein. Pulses:  2+ rad pulses Neurologic:  sensation intact, strength slightly diminished L wrist 2/2 pain, strength intact intriniscs of hand and thumb. Skin:  Intact without suspicious lesions or rashes   Impression & Recommendations:  Problem # 1:  GANGLION CYST, WRIST, LEFT (ICD-727.41) inflammed.  treat with continued wrist brace, ice, and NSAIDs for next 1-2 wks.  advised to return in 2 wks for eval with Dr. Patsy Lager to consider aspiration/steroid injection and re eval.  return sooner if worsening.  if all better may cancel appt.  Complete Medication List: 1)  Nasonex 50 Mcg/act Susp (Mometasone furoate) .... 2 sprays in each nostril daily 2)  Tylenol Allergy Sinus 2-30-500 Mg Tabs (Chlorphen-pseudoephed-apap) .... Otc as directed. 3)  Multivitamins Tabs (Multiple vitamin) .... Take 1 tablet by mouth once a day 4)  Allegra Allergy 180 Mg Tabs (Fexofenadine hcl) .... One daily 5)  Naprosyn 500 Mg Tabs (Naproxen) .... Take one by mouth two times a day x 7 days then as needed pain, take with food  Patient Instructions: 1)  Looks like an inflammed ganglion cyst. 2)  Treat with anti inflammatory twice daily for 7 days then as needed as well as ice to area. 3)  Schedule appointment with Dr. Patsy Lager for further evaluation in 2 wks.  May cancel if all better. 4)  Continue wrist brace. Prescriptions: NAPROSYN 500 MG  TABS (NAPROXEN) take one by mouth two times a day x 7 days then as needed pain, take with food  #30 x 0   Entered and Authorized by:   Eustaquio Boyden  MD   Signed by:   Eustaquio Boyden  MD on 03/03/2010   Method used:   [facility]        [facility] [id number]       [address]       [place], [place]  [address]       Ph: [phone] or [phone]       Fax: [phone]   RxID:   [id number]    Orders Added: 1)  Est. Patient Level III  [36644]    Current Allergies (reviewed today): No known allergies

## 2010-03-18 ENCOUNTER — Encounter (INDEPENDENT_AMBULATORY_CARE_PROVIDER_SITE_OTHER): Payer: BC Managed Care – PPO | Admitting: Family Medicine

## 2010-03-18 ENCOUNTER — Other Ambulatory Visit: Payer: Self-pay | Admitting: Family Medicine

## 2010-03-18 ENCOUNTER — Encounter: Payer: Self-pay | Admitting: Family Medicine

## 2010-03-18 DIAGNOSIS — E785 Hyperlipidemia, unspecified: Secondary | ICD-10-CM

## 2010-03-18 DIAGNOSIS — Z0000 Encounter for general adult medical examination without abnormal findings: Secondary | ICD-10-CM

## 2010-03-18 DIAGNOSIS — R7309 Other abnormal glucose: Secondary | ICD-10-CM

## 2010-03-18 DIAGNOSIS — J019 Acute sinusitis, unspecified: Secondary | ICD-10-CM

## 2010-03-18 LAB — LIPID PANEL
Cholesterol: 207 mg/dL — ABNORMAL HIGH (ref 0–200)
HDL: 66.5 mg/dL (ref >39.00)
Total CHOL/HDL Ratio: 3
Triglycerides: 49 mg/dL (ref 0.0–149.0)
VLDL: 9.8 mg/dL (ref 0.0–40.0)

## 2010-03-18 LAB — CBC WITH DIFFERENTIAL/PLATELET
Basophils Absolute: 0 10*3/uL (ref 0.0–0.1)
Basophils Relative: 0.5 % (ref 0.0–3.0)
Eosinophils Absolute: 0.1 10*3/uL (ref 0.0–0.7)
Eosinophils Relative: 2.1 % (ref 0.0–5.0)
HCT: 39.2 % (ref 36.0–46.0)
Hemoglobin: 12.9 g/dL (ref 12.0–15.0)
Lymphocytes Relative: 25 % (ref 12.0–46.0)
Lymphs Abs: 1.8 10*3/uL (ref 0.7–4.0)
MCHC: 32.8 g/dL (ref 30.0–36.0)
MCV: 80.9 fl (ref 78.0–100.0)
Monocytes Absolute: 0.8 10*3/uL (ref 0.1–1.0)
Monocytes Relative: 11.1 % (ref 3.0–12.0)
Neutro Abs: 4.3 10*3/uL (ref 1.4–7.7)
Neutrophils Relative %: 61.3 % (ref 43.0–77.0)
Platelets: 212 10*3/uL (ref 150.0–400.0)
RBC: 4.84 Mil/uL (ref 3.87–5.11)
RDW: 15.5 % — ABNORMAL HIGH (ref 11.5–14.6)
WBC: 7.1 10*3/uL (ref 4.5–10.5)

## 2010-03-18 LAB — HEPATIC FUNCTION PANEL
ALT: 13 U/L (ref 0–35)
AST: 22 U/L (ref 0–37)
Albumin: 4.1 g/dL (ref 3.5–5.2)
Bilirubin, Direct: 0.1 mg/dL (ref 0.0–0.3)
Total Bilirubin: 0.6 mg/dL (ref 0.3–1.2)
Total Protein: 7.8 g/dL (ref 6.0–8.3)

## 2010-03-18 LAB — LDL CHOLESTEROL, DIRECT: Direct LDL: 125.4 mg/dL

## 2010-03-18 LAB — BASIC METABOLIC PANEL
CO2: 29 mEq/L (ref 19–32)
Chloride: 101 mEq/L (ref 96–112)
Creatinine, Ser: 0.9 mg/dL (ref 0.4–1.2)
GFR: 93.48 mL/min (ref >60.00)
Glucose, Bld: 98 mg/dL (ref 70–99)
Potassium: 4 mEq/L (ref 3.5–5.1)
Sodium: 137 mEq/L (ref 135–145)

## 2010-03-18 LAB — CONVERTED CEMR LAB: Rapid Strep: NEGATIVE

## 2010-03-18 LAB — TSH: TSH: 0.91 u[IU]/mL (ref 0.35–5.50)

## 2010-03-26 ENCOUNTER — Encounter: Payer: Self-pay | Admitting: Family Medicine

## 2010-03-26 ENCOUNTER — Ambulatory Visit (INDEPENDENT_AMBULATORY_CARE_PROVIDER_SITE_OTHER): Payer: BC Managed Care – PPO

## 2010-03-26 DIAGNOSIS — Z111 Encounter for screening for respiratory tuberculosis: Secondary | ICD-10-CM

## 2010-03-26 NOTE — Assessment & Plan Note (Signed)
Summary: CPX,?SINUS INFECTION,TB TEST/CLE  [employer]   Vital Signs:  Patient profile:   40 year old female Height:      63.25 inches Weight:      147.25 pounds BMI:     25.97 Temp:     99 degrees F oral Pulse rate:   88 / minute Pulse rhythm:   regular BP sitting:   126 / 78  (left arm) Cuff size:   regular  Vitals Entered By: Lewanda Rife LPN (March 18, 2010 10:00 AM) CC: CPX GYN did pap and breast exam and pt has form for [employer] schools also ? sinus infection sorethroat, head congestion, sick since 03/12/10  Vision Screening:Left eye w/o correction: 20 / 25 Right Eye w/o correction: 20 / 25 Both eyes w/o correction:  20/ 25        Vision Entered By: Lewanda Rife LPN (March 18, 2010 10:01 AM)  Hearing Screen 25db HL: Left  500 hz: 25db 1000 hz: 25db 2000 hz: 25db 4000 hz: 25db Right  500 hz: 25db 1000 hz: 25db 2000 hz: 25db 4000 hz: 25db    History of Present Illness: here for wellness exam/ to review chronic problems and also to address uri symptoms in addition- fill out form for school  other than uri - pretty good   wt is down 5 lb with bmi of 25  126/78 great bp  gyn visit-- seen in jan - Dr Vincente Poli  pap was normal  no new gyn problems  no  contraception  mam nl in december   uri symptoms-- started about a week ago  has actually had cold after cold since nov-- gets a little better and then worse  last was mid dec  nasal congestion and headache and st (neg strep)  thinks she may have sinus infx- pain in face and green nasal discharge  has also had bad allergies this season - may have had sinusitis from this too  takes allegra and flonase   hx of hyperglycemia and gest. DM- is due for a check  has been trying to loose weight -- hard to exercise when she is sick   lipids high in past - but good last check with LDL 99     Allergies (verified): No Known Drug Allergies  Past History:  Past Surgical History: Last updated:  10/01/2006 Pilonidal cyst (02/2003),  surgery (09/2003) Uterine fibroids Hystosalpingogram- neg 07/06/2004)  Family History: Last updated: 03/18/2010 Father: DM , open heart surgery (? 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 pos hep B test  migraine     Dr Vincente Poli   Family History: Father: DM , open heart surgery (? what kind)  Mother: died of liver cancer 07/06/08 Siblings: 3 sisters sister - headaches   Review of Systems General:  Complains of fatigue; denies chills, fever, loss of appetite, and malaise. Eyes:  Denies blurring, eye irritation, and eye pain. ENT:  Complains of earache, nasal congestion, postnasal drainage, sinus pressure, and sore throat. CV:  Denies chest pain or discomfort and palpitations. Resp:  Complains of cough; denies shortness of breath and wheezing. GI:  Denies indigestion, nausea, and vomiting. GU:  Denies abnormal vaginal bleeding, discharge, dysuria, and urinary  frequency. MS:  Denies joint pain, joint redness, joint swelling, and stiffness. Derm:  Denies itching, lesion(s), poor wound healing, and rash. Neuro:  Denies headaches, numbness, and tingling. Psych:  mood is ok . Endo:  Denies excessive thirst and excessive urination. Heme:  Denies abnormal bruising and bleeding.  Physical Exam  General:  Well-developed,well-nourished,in no acute distress; alert,appropriate and cooperative throughout examination Head:  normocephalic, atraumatic, and no abnormalities observed.  bilat frontal and maxillary sinus tenderness  Eyes:  vision grossly intact, pupils equal, pupils round, pupils reactive to light, and no injection.    Ears:  R ear normal and L ear normal.   Nose:  nares are injected and congested bilaterally  Mouth:  pharynx pink and moist, no erythema, and no exudates.   Neck:  supple with full rom and no masses or thyromegally, no JVD or carotid bruit  Chest Wall:  No deformities, masses, or tenderness noted. Lungs:  Normal respiratory effort, chest expands symmetrically. Lungs are clear to auscultation, no crackles or wheezes. Heart:  Normal rate and regular rhythm. S1 and S2 normal without gallop, murmur, click, rub or other extra sounds. Abdomen:  Bowel sounds positive,abdomen soft and non-tender without masses, organomegaly or hernias noted. no renal bruits  Msk:  No deformity or scoliosis noted of thoracic or lumbar spine.  no acute joint changes  Pulses:  2+ rad pulses Extremities:  No clubbing, cyanosis, edema, or deformity noted with normal full range of motion of all joints.   Neurologic:  sensation intact to light touch and gait normal.   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 ppd after sinusitis is resolved (no restrictions) Orders: Venipuncture (54098) TLB-Lipid Panel (80061-LIPID) TLB-BMP (Basic Metabolic Panel-BMET) (80048-METABOL) TLB-CBC Platelet - w/Differential (85025-CBCD) TLB-Hepatic/Liver Function Pnl (80076-HEPATIC) TLB-TSH (Thyroid Stimulating Hormone) (84443-TSH) Audiometry (11914) Vision Screening (78295)  Problem # 2:  HYPERGLYCEMIA (ICD-790.29) Assessment: Unchanged  sugar with todays labs doing well - good diet and  exercise Orders: Venipuncture (62130) TLB-Lipid Panel (80061-LIPID) TLB-BMP (Basic Metabolic Panel-BMET) (80048-METABOL) TLB-CBC Platelet - w/Differential (85025-CBCD) TLB-Hepatic/Liver Function Pnl (80076-HEPATIC) TLB-TSH (Thyroid Stimulating Hormone) (84443-TSH)  Labs Reviewed: Creat: 0.8 (01/23/2009)     Problem # 3:  HYPERLIPIDEMIA (ICD-272.4) Assessment: Unchanged  this has actually been well controlled lipids done with wellness prof today disc low sat fat diet   Labs Reviewed: SGOT: 17 (01/23/2009)   SGPT: 10 (01/23/2009)   HDL:51.00 (01/23/2009)  LDL:99 (01/23/2009)  Chol:166 (01/23/2009)  Trig:82.0 (01/23/2009)  Problem # 4:  SINUSITIS, ACUTE (ICD-461.9) Assessment: New cover with augmentin recommend sympt care- see pt instructions   pt advised to update me if symptoms worsen or do not improve  The following medications were removed from the medication list:    Nasonex 50 Mcg/act Susp (Mometasone furoate) .Marland Kitchen... 2 sprays in each nostril daily Her updated medication list for this problem includes:    Tylenol Allergy Sinus 2-30-500 Mg Tabs (Chlorphen-pseudoephed-apap) ..... Otc as directed.    Flonase 50 Mcg/act Susp (Fluticasone propionate) .Marland Kitchen... 2 sprays each nostril daily as needed    Augmentin 875-125 Mg Tabs (Amoxicillin-pot clavulanate) .Marland Kitchen... 1 by mouth two  times a day for 10 days for sinus infection  Complete Medication List: 1)  Tylenol Allergy Sinus 2-30-500 Mg Tabs (Chlorphen-pseudoephed-apap) .... Otc as directed. 2)  Multivitamins Tabs (Multiple vitamin) .... Take 1 tablet by mouth once a day 3)  Allegra Allergy 180 Mg Tabs (Fexofenadine hcl) .... One daily 4)  Flonase 50 Mcg/act Susp (Fluticasone propionate) .... 2 sprays each nostril daily as needed 5)  Augmentin 875-125 Mg Tabs (Amoxicillin-pot clavulanate) .Marland Kitchen.. 1 by mouth two times a day for 10 days for sinus infection  Patient Instructions: 1)  when you are feeling better get a flu shot at any pharmcy   2)  try otc zyrtec instead of allegra for allergies  3)  continue flonase 4)  take augmentin as directed for sinus infection 5)  drink lots of fluids  6)  also use saline nasal spray or netti pot  7)  schedule follow up nurse visit for PPD -- any day but a thursday when you are feelig better 8)  I will hang on to your paperwork 9)  labs today  Prescriptions: FLONASE 50 MCG/ACT SUSP (FLUTICASONE PROPIONATE) 2 sprays each nostril daily as needed  #1 mdi x 11   Entered and Authorized by:   Judith Part MD   Signed by:   Judith Part MD on 03/18/2010   Method used:   [facility] [id number]       [address]       [place], [place]  [address]       Ph: [phone] or [phone]       Fax: [phone]   RxID:   [id number] AUGMENTIN 875-125 MG TABS (AMOXICILLIN-POT CLAVULANATE) 1 by mouth two times a day for 10 days for sinus infection  #20 x 0   Entered and Authorized by:   Judith Part MD   Signed by:   Judith Part MD on 03/18/2010   Method used:   [facility]        [facility] [id number]       [address]       [place], [place]  [address]       Ph: [phone] or [phone]       Fax: [phone]   RxID:   [id number]    Orders Added: 1)  Venipuncture [31517] 2)  TLB-Lipid Panel [80061-LIPID] 3)  TLB-BMP (Basic Metabolic Panel-BMET) [80048-METABOL] 4)  TLB-CBC Platelet - w/Differential [85025-CBCD] 5)  TLB-Hepatic/Liver Function Pnl [80076-HEPATIC] 6)  TLB-TSH (Thyroid Stimulating Hormone) [84443-TSH] 7)  Audiometry [92552] 8)  Vision Screening [99173] 9)  Est. Patient 40-64 years [99396] 10)  Est. Patient Level II [61607]   Immunization History:  Tetanus/Td Immunization History:    Tetanus/Td:  td (02/03/2003)   Immunization History:  Tetanus/Td Immunization History:    Tetanus/Td:  Td (02/03/2003)  Current Allergies (reviewed today): No known allergies   Laboratory  Results  Date/Time Received: March 18, 2010 10:04 AM  Date/Time Reported: March 18, 2010 10:04 AM   Other Tests  Rapid Strep: negative     Preventive Care Screening  Pap Smear:    Date:  02/19/2010    Results:  normal   Mammogram:    Date:  01/02/2010    Results:  normal

## 2010-04-01 ENCOUNTER — Ambulatory Visit: Payer: BC Managed Care – PPO

## 2010-04-01 NOTE — Assessment & Plan Note (Signed)
Summary: TB TEST/CLE  ORDERED BY DR Nancy Marus  Nurse Visit   Vital Signs:  Patient profile:   40 year old female Height:      63.25 inches Weight:      147.25 pounds  Vitals Entered By: Delilah Shan CMA Duncan Dull) (March 26, 2010 11:02 AM)  Allergies: No Known Drug Allergies  Immunizations Administered:  PPD Skin Test:    Vaccine Type: PPD    Site: left forearm    Mfr: Sanofi Pasteur    Dose: 0.1 ml    Route: ID    Exp. Date: 10/04/2011    Lot #: [id number]  PPD Results    Date of reading: 03/28/2010    Results: < 5mm    Interpretation: negative  Orders Added: 1)  TB Skin Test [86580] 2)  Admin 1st Vaccine [95284]

## 2010-04-10 NOTE — Letter (Signed)
Summary: [place] Engineer, manufacturing systems  [employer] Health Examination Certificate   Imported By: Beau Fanny 03/31/2010 15:32:15  _____________________________________________________________________  External Attachment:    Type:   Image     Comment:   External Document

## 2010-05-19 LAB — CBC
HCT: 42.3 % (ref 36.0–46.0)
Hemoglobin: 13.7 g/dL (ref 12.0–15.0)
MCHC: 32.3 g/dL (ref 30.0–36.0)
MCV: 80.2 fL (ref 78.0–100.0)
Platelets: 227 10*3/uL (ref 150–400)
RBC: 5.28 MIL/uL — ABNORMAL HIGH (ref 3.87–5.11)
RDW: 15.2 % (ref 11.5–15.5)
WBC: 7.1 10*3/uL (ref 4.0–10.5)

## 2010-05-19 LAB — URINE CULTURE: Colony Count: >=100000

## 2010-05-19 LAB — URINALYSIS, ROUTINE W REFLEX MICROSCOPIC
Bilirubin Urine: NEGATIVE
Glucose, UA: NEGATIVE mg/dL
Hgb urine dipstick: NEGATIVE
Nitrite: NEGATIVE
Protein, ur: 30 mg/dL — AB
Specific Gravity, Urine: 1.025 (ref 1.005–1.030)
Urobilinogen, UA: 0.2 mg/dL (ref 0.0–1.0)
pH: 7.5 (ref 5.0–8.0)

## 2010-05-19 LAB — COMPREHENSIVE METABOLIC PANEL
ALT: 19 U/L (ref 0–35)
AST: 25 U/L (ref 0–37)
Albumin: 4.3 g/dL (ref 3.5–5.2)
Alkaline Phosphatase: 88 U/L (ref 39–117)
BUN: 14 mg/dL (ref 6–23)
CO2: 26 mEq/L (ref 19–32)
Calcium: 8.7 mg/dL (ref 8.4–10.5)
Chloride: 102 mEq/L (ref 96–112)
Creatinine, Ser: 0.68 mg/dL (ref 0.4–1.2)
GFR calc Af Amer: >60 mL/min (ref >60)
GFR calc non Af Amer: >60 mL/min (ref >60)
Glucose, Bld: 137 mg/dL — ABNORMAL HIGH (ref 70–99)
Potassium: 3.6 mEq/L (ref 3.5–5.1)
Sodium: 137 mEq/L (ref 135–145)
Total Bilirubin: 0.7 mg/dL (ref 0.3–1.2)
Total Protein: 8 g/dL (ref 6.0–8.3)

## 2010-05-19 LAB — DIFFERENTIAL
Basophils Absolute: 0 10*3/uL (ref 0.0–0.1)
Basophils Relative: 0 % (ref 0–1)
Eosinophils Absolute: 0.1 10*3/uL (ref 0.0–0.7)
Eosinophils Relative: 1 % (ref 0–5)
Lymphocytes Relative: 6 % — ABNORMAL LOW (ref 12–46)
Lymphs Abs: 0.4 10*3/uL — ABNORMAL LOW (ref 0.7–4.0)
Monocytes Absolute: 0.3 10*3/uL (ref 0.1–1.0)
Monocytes Relative: 4 % (ref 3–12)
Neutro Abs: 6.3 10*3/uL (ref 1.7–7.7)
Neutrophils Relative %: 88 % — ABNORMAL HIGH (ref 43–77)

## 2010-05-19 LAB — PREGNANCY, URINE: Preg Test, Ur: NEGATIVE

## 2010-05-19 LAB — URINE MICROSCOPIC-ADD ON

## 2010-05-19 LAB — LIPASE, BLOOD: Lipase: 16 U/L (ref 11–59)

## 2010-06-17 NOTE — Op Note (Signed)
NAME:  Ellen [name], Ellen [name]             ACCOUNT NO.:  [id number]   MEDICAL RECORD NO.:  [id number]          PATIENT TYPE:  INP   LOCATION:  9145                          FACILITY:  [facility]   PHYSICIAN:  Michelle L. Grewal, M.D.DATE OF BIRTH:  10/02/1970   DATE OF PROCEDURE:  09/15/2007  DATE OF DISCHARGE:                               OPERATIVE REPORT   PREOPERATIVE DIAGNOSES:  1. Intrauterine pregnancy at term.  2. Previous cesarean section and desires repeat cesarean section.   POSTOPERATIVE DIAGNOSES:  1. Intrauterine pregnancy at term.  2. Previous cesarean section and desires repeat cesarean section.   PROCEDURE:  Repeat low transverse cesarean section.   SURGEON:  Michelle L. Grewal, MD   ANESTHESIA:  Spinal.   ESTIMATED BLOOD LOSS:  Less than 500 mL.   FINDINGS:  Female infant in cephalic presentation, Apgars 9 at 1 minutes  and 9 at 5 minutes.   PROCEDURE:  The patient was taken to the operating room after informed  consent was obtained.  She was then prepped and draped in the usual  sterile fashion.  A Foley catheter was inserted.  After her spinal was  found to be adequate, a low transverse incision was made, carried down  to the fascia, fascia was scored in the midline and extended laterally.  The rectus muscles were separated in the midline.  The peritoneum was  entered bluntly and the peritoneal incision was then stretched.  The  bladder blade was inserted, the lower uterine segment was identified,  and the bladder flap was created sharply and then digitally.  A low  transverse incision was made in the uterus, the uterus was entered using  a hemostat.  The baby was in cephalic presentation and was delivered  easily with vacuum extractor without a pop-off.  The baby was a female  infant and appeared to be large for gestational age.  He was handed to  the waiting neonatal team and taken to newborn nursery.  Apgars were 9  at 1 minute and 9 at 5 minutes.  The cord had been  clamped and cut.  Antibiotics and Pitocin were given.  The uterus was exteriorized and the  placenta was delivered manually and noted to be normal, intact with 3-  vessel cord.  The uterus was cleared of all clots and debris.  The  uterine incision was closed in 2 layers using 0 chromic in a running  locked stitch. Hemostasis was excellent.  The uterus was returned to the  abdomen.  Irrigation was performed.  Hemostasis was again noted.  The peritoneum rectus muscles were reapproximated using 0 Vicryl.  The  fascia was closed using 0 Vicryl running stitch.  After irrigation of  subcutaneous layer, the skin was closed with staples.  All sponge, lap,  and instrument counts were correct x2.  The patient went to recovery  room in stable condition.      Michelle L. Vincente Poli, M.D.  Electronically Signed     MLG/MEDQ  D:  09/16/2007  T:  09/16/2007  Job:  [id number]

## 2010-06-20 NOTE — Discharge Summary (Signed)
Ellen [name], Ellen [name] NO.:  [id number]   MEDICAL RECORD NO.:  [id number]          PATIENT TYPE:  INP   LOCATION:  9145                          FACILITY:  [facility]   PHYSICIAN:  Juluis Mire, M.D.   DATE OF BIRTH:  1970-07-01   DATE OF ADMISSION:  09/18/2007  DATE OF DISCHARGE:  09/18/2007                               DISCHARGE SUMMARY   ADMITTING DIAGNOSES:  1. Intrauterine pregnancy at term.  2. Previous cesarean section, desires repeat.  3. Gestational diabetes, diet-controlled.   DISCHARGE DIAGNOSIS:  1. Status post low transverse cesarean section.  2. Viable female infant.   PROCEDURE:  Repeat low-transverse cesarean section.   REASON FOR ADMISSION:  Please see written H&P.   HOSPITAL COURSE:  The patient is a 40 year old gravida 2, para 1 that  presents to [facility] for scheduled cesarean section.  Pregnancy had been complicated with gestational diabetes, which had been  well controlled on diet.  On admission, vital signs were stable.  She  was afebrile.  Abdomen soft.  Cervix was closed.  The patient was then  transferred to the operating room where spinal anesthesia was  administered without difficulty.  A low-transverse incision was made  with delivery of a viable female infant with Apgars of 9 at 1 minute and 9  at 5 minutes.  The patient tolerated the procedure well and taken to the  recovery room in stable condition.  On postoperative day #1, the patient  denied headache, blurred vision, or right upper quadrant pain.  She did  complain of some abdominal tenderness.  Vital signs were stable with  blood pressure 124/84 to 124/73.  Deep tendon reflexes were 1-2+.  No  clonus and no pedal edema was observed.  Abdomen was slightly distended  with decreased bowel sounds.  Fundus was firm and nontender.  Abdominal  dressing was noted to have a small amount of drainage noted on the  bandage.  Foley was draining with adequate amounts of urine  output.  Laboratory findings showed hemoglobin 9.6, platelet count 150,000, and  WBC count of 11.6.  Foley was discontinued and IV was reduced to a  saline lock.  PIH labs were ordered for the following morning and a  fasting blood sugar.  On postoperative day #2, the patient was without  complaint.  Vital signs remained stable.  She was afebrile.  Laboratory  findings revealed hemoglobin of 9.4 and platelet count of 123,000.  Liver function tests were within normal limits.  On postoperative day  #3, the patient was without complaint.  Vital signs remained stable.  She is afebrile.  Fundus firm and nontender.  Incision was clean, dry,  and intact.  Laboratory findings showed hemoglobin 9.0 and platelet  count of 248,000.  Discharge instructions were reviewed and the patient  was later discharged home.   CONDITION ON DISCHARGE:  Stable.   DIET:  Regular as tolerated.   ACTIVITY:  No heavy lifting, no driving x2 weeks, no vaginal entry.   FOLLOW UP:  The patient is to follow up in the office in 1-2 weeks for  incision check.  She is to call for temperature greater than 100  degrees, persistent nausea, vomiting, heavy vaginal bleeding, and/or  redness, or drainage from the incisional site.  The patient was also  instructed to call for headache, blurred vision, or right upper quadrant  pain.   DISCHARGE MEDICATIONS:  1. Tylox #30 one p.o. every 4-6 hours p.r.n.  2. Motrin 600 mg every 6 hours.  3. Prenatal vitamins 1 p.o. daily.      Julio Sicks, N.P.      Juluis Mire, M.D.  Electronically Signed    CC/MEDQ  D:  10/04/2007  T:  10/05/2007  Job:  [id number]

## 2010-06-20 NOTE — Op Note (Signed)
Ellen [name], Ellen [name]                         ACCOUNT NO.:  [id number]   MEDICAL RECORD NO.:  [id number]                   PATIENT TYPE:  AMB   LOCATION:  DSC                                  FACILITY:  [facility]   PHYSICIAN:  Vikki Ports, M.D.         DATE OF BIRTH:  October 26, 1970   DATE OF PROCEDURE:  09/04/2003  DATE OF DISCHARGE:                                 OPERATIVE REPORT   REFERRING PHYSICIAN:  Marne A. Milinda Antis, M.D.   PREOPERATIVE DIAGNOSIS:  Pilonidal cyst in sinus.   POSTOPERATIVE DIAGNOSIS:  Pilonidal cyst in sinus.   PROCEDURE:  Pilonidal cystectomy.   SURGEON:  Vikki Ports, M.D.   ANESTHESIA:  General anesthesia.   DESCRIPTION OF PROCEDURE:  The patient was taken to the operating room and  placed in the supine position.  After adequate general anesthesia was  induced using endotracheal tube, the patient was placed in the prone  position.  Superior gluteal cleft area was prepped and draped in normal  sinus rhythm and taped apart.  An elliptical incision was made around the  open sinus tracts, dissecting down onto the periosteum of the coccyx.  All  tissue was excised down to this level and sent for pathologic evaluation.  Adequate hemostasis was insured.  The wound was then closed, tension free,  with interrupted 2-0 Vicryl suture in the deep layer, subcutaneous with 2-0  Vicryl in the subdermal area and the skin was reapproximated with  interrupted 3-0 nylon vertical mattress sutures.  Sterile dressing was  applied.  The patient tolerated the procedure well and went to PACU in good  condition.                                               Vikki Ports, M.D.    KRH/MEDQ  D:  09/04/2003  T:  09/04/2003  Job:  [id number]   cc:   Marne A. Milinda Antis, M.D. [facility]

## 2010-06-20 NOTE — H&P (Signed)
NAME:  Ellen [name], Ellen [name] NO.:  [id number]   MEDICAL RECORD NO.:  [id number]          PATIENT TYPE:  INP   LOCATION:  NA                            [facility]   PHYSICIAN:  Freddy Finner, M.D.   DATE OF BIRTH:  1970/11/17   DATE OF ADMISSION:  08/21/2005  DATE OF DISCHARGE:                                HISTORY & PHYSICAL   ADMISSION DIAGNOSIS:  Intrauterine pregnancy at term, mild late onset  gestational hypertension with normal labs today, unfavorable cervix with  borderline mid-pelvis and floating vertex on an estimated 8 pound fetus,  patient admitted for primary cesarean delivery at her request in what is  clinically an appropriate situation.   HISTORY OF PRESENT ILLNESS:  The patient is a 40 year old whose prenatal  course was uncomplicated until approximately one week prior to this  admission at which time she had mild elevation of her blood pressure.  She  has had PIH labs and biophysical profiles all of which have shown a normal  fetus and normal PIH labs.  Her pressure has remained mildly elevated.  She  is unfavorable for induction.  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  She has requested cesarean delivery which seems medically  appropriate and she is admitted at this time for that purpose.   REVIEW OF SYMPTOMS:  Her current review of systems is negative.  No  cardiopulmonary or GI symptoms.   PAST MEDICAL HISTORY:  Recorded in detail in the prenatal summary and will  not be repeated in this dictation.   PHYSICAL EXAMINATION:  HEENT:  Grossly within normal limits.  Thyroid gland is not palpably  enlarged.  VITAL SIGNS:  Blood pressure in the office 128/82.  CHEST:  Clear to auscultation throughout.  HEART:  Normal sinus rhythm with a grade 2/6 early systolic musical murmur.  ABDOMEN:  Gravid with estimated fetal size of less than or equal to 8  pounds.  PELVIC:  Cervix is long, closed, firm, presenting part is floating in the  pelvis.  EXTREMITIES:  Without cyanosis, clubbing, and edema.   ASSESSMENT:  Term pregnancy, anthropoid pelvis, very unfavorable cervix,  mild late onset gestational hypertension.   PLAN:  Primary cesarean delivery.      Freddy Finner, M.D.  Electronically Signed     WRN/MEDQ  D:  08/20/2005  T:  08/20/2005  Job:  [id number]

## 2010-06-20 NOTE — Op Note (Signed)
Ellen [name], Ellen [name] NO.:  [id number]   MEDICAL RECORD NO.:  [id number]          PATIENT TYPE:  INP   LOCATION:  9199                          [facility]ACILITY:  [facility]   PHYSICIAN:  Freddy Finner, M.D.   DATE OF BIRTH:  Sep 07, 1970   DATE OF PROCEDURE:  08/21/2005  DATE OF DISCHARGE:                                 OPERATIVE REPORT   PREOPERATIVE DIAGNOSES:  1.  Intrauterine pregnancy at term.  2.  Relatively-large infant estimated at 8 pounds.  3.  Anthropoid pelvis with borderline mid pelvis.  4.  Unfavorable cervix with floating presenting part.   POSTOPERATIVE DIAGNOSES:  1.  Intrauterine pregnancy at term.  2.  Relatively-large infant estimated at 8 pounds.  3.  Anthropoid pelvis with borderline mid pelvis.  4.  Unfavorable cervix with floating presenting part.   OPERATIVE PROCEDURE:  Primary low transverse cervical cesarean section,  delivery of viable female infant, Apgars of 9 and 9, cord pH of 7.31.   ANESTHESIA:  Spinal   ESTIMATED INTRAOPERATIVE BLOOD LOSS:  Less than or equal to 800 mL.   INTRAOPERATIVE COMPLICATIONS:  None.   CONDITION TO RECOVERY ROOM:  Good.   The patient is a 40 year old primigravida with findings as noted in the  preoperative diagnoses.  Also, she had late mild onset of gestational  hypertension.  She was requesting cesarean delivery and is admitted at this  time for that purpose.   She was admitted on the morning of surgery, brought to the operating room,  placed under adequate spinal anesthesia, placed in the dorsal recumbent  position, wedge placed under the right hip about 15 degrees.  Abdomen was  prepped and draped in the usual fashion.  Foley catheter placed using  sterile technique.  A lower abdominal transverse incision was made and  carried sharply down to the fascia.  The fascia was then sharply extended to  the extent of the skin incision.  The rectus sheath was developed superiorly  and inferiorly with blunt and  sharp dissection.  The rectus muscles were  divided in the midline.  The peritoneum was entered sharply and extended  bluntly to the extent of the skin incision.  Bladder blade was placed.  Transverse incision was made in the visceral peritoneum overlying the lower  segment, the bladder dissected off the lower segment.  A transverse incision  was made in the lower segment and carried down to the membrane.  Amniotic  fluid was clear.  Incision was extended bluntly in a transverse direction.  Using the Kiwi vacuum extractor the infant was then delivered without  difficulty.  Apgars and cord pH are noted above.  Three-vessel cord was  noted.  Placenta and other products of conception were removed from the  uterus, the uterus delivered on the anterior abdominal wall.  There were  several subserosal and intramural myomas.  Tubes and ovaries were normal.  Manual exploration confirmed complete evacuation of products of conception.  Uterine incisions closed in a double layer of running locking 0 Monocryl for  the first layer and an imbricating suture of 0 Monocryl for  the second.  This also approximated the bladder flap.  The uterus was placed back into  the abdominal cavity.  Irrigation was carried out, hemostasis was complete.  The abdominal incision was closed in layers.  Running 0 Monocryl was used to  close the peritoneum and reapproximate the rectus muscles.  The fascia was  closed with running 0 PDS.  The subcutaneous tissue was approximated with  running 0 suture.  Skin was closed with wide skin staples and 0.25-inch  Steri-Strips.  The patient was taken to the recovery room in good condition.      Freddy Finner, M.D.  Electronically Signed     WRN/MEDQ  D:  08/21/2005  T:  08/21/2005  Job:  [id number]

## 2010-06-20 NOTE — Discharge Summary (Signed)
Ellen [name], Ellen [name]             ACCOUNT NO.:  [id number]   MEDICAL RECORD NO.:  [id number]          PATIENT TYPE:  INP   LOCATION:  9138                          [facility]ACILITY:  [facility]   PHYSICIAN:  Dineen Kid. Rana Snare, M.D.    DATE OF BIRTH:  1971-01-03   DATE OF ADMISSION:  08/21/2005  DATE OF DISCHARGE:  08/25/2005                                 DISCHARGE SUMMARY   ADMITTING DIAGNOSES:  1. Intrauterine pregnancy at term.  2. Suspect macrosomia.  3. Anthropoid pelvis with unfavorable cervix.  4. Late onset of gestational hypertension.   DISCHARGE DIAGNOSES:  1. Status post low transverse cesarean section.  2. Viable female infant.  3. Gestational hypertension.   PROCEDURE:  Primary low transverse cesarean section.   REASON FOR ADMISSION:  Please see dictated H&P.   HOSPITAL COURSE:  The patient is a 40 year old married female primigravida  that was admitted to [facility] for a scheduled cesarean  section.  The patient had known anthropoid pelvis with a small mid pelvis  with a suspect large-for-gestational-age infant.  Due to unfavorable for  induction of labor, decision was made to admit the patient for a primary  cesarean section.  The patient's pregnancy had been uncomplicated until  approximately 1 week prior to admission at which time the patient had been  noted to have some mild elevation in her blood pressure.  PIH labs had been  performed which were within normal limits and biophysical profiles all had  shown a normal fetus.  On the morning of admission the patient was taken to  the operating room where spinal anesthesia was administered without  difficulty.  A low transverse incision was made with the delivery of a  viable female infant weighing 7 pounds 13 ounces with Apgars of 9 at one  minute and 9 at five minutes.  The patient tolerated the procedure well and  was taken to the recovery room in stable condition.  Early the following  morning the patient was  noted to have some vaginal bleeding with small  clots.  The patient was given Methergine with resolution of bleeding.  Clotting factors were also drawn which were within normal limits.  Later  that morning the patient was without complaint.  Vital signs were stable.  Fundus was firm and nontender.  Abdominal dressing was clean, dry and  intact.  Laboratory findings revealed hemoglobin of 9.8; platelet count  174,000; wbc count of 13.3.  On postoperative day #2 the patient was without  complaint.  Vital signs remained stable.  Fundus firm and nontender.  Abdominal dressing had been removed revealing an incision that was clean,  dry and intact.  The patient was ambulating well.  Laboratory findings  revealed hemoglobin stable of 9.1; platelet count 189,000; wbc count of  11.2.  On postoperative day #3 the patient denied headache, blurred vision  or right upper quadrant pain.  Vital signs remained stable.  She was  afebrile.  Blood pressure 140-148 over 85-92.  Abdomen was slightly  distended.  Fundus was firm and nontender.  Incision was clean, dry and  intact.  On postoperative day #4 the patient continued to deny headache,  blurred vision or right upper quadrant pain.  Vital signs were stable.  Blood pressure was 131-138 over 89-91.  Deep tendon reflexes were 1+ without  clonus.  Abdomen soft.  Fundus firm and nontender.  Incision was clean, dry  and intact.  Staples were removed and the patient was later discharged home.   CONDITION ON DISCHARGE:  Good.   DIET:  Regular as tolerated.   ACTIVITY:  No heavy lifting, no driving x2 weeks, no vaginal entry.   FOLLOWUP:  The patient to follow up in the office in 2-3 days for an  incision check and blood pressure check.  She is to call for temperature  greater than 100 degrees, persistent nausea and vomiting, heavy vaginal  bleeding, and/or redness or drainage from the incisional site.  The patient  was also encouraged to call for headache  unrelieved by Tylenol, blurred  vision or epigastric discomfort.   DISCHARGE MEDICATIONS:  1. Percocet 5/325 #30 one p.o. every 4-6 hours p.r.n.  2. Motrin 600 mg every 6 hours.  3. Prenatal vitamins one p.o. daily.  4. Colace one p.o. daily p.r.n.      Julio Sicks, N.P.      Dineen Kid Rana Snare, M.D.  Electronically Signed    CC/MEDQ  D:  09/23/2005  T:  09/23/2005  Job:  [id number]

## 2010-07-22 ENCOUNTER — Encounter: Payer: Self-pay | Admitting: Family Medicine

## 2010-07-23 ENCOUNTER — Encounter: Payer: Self-pay | Admitting: Family Medicine

## 2010-07-23 ENCOUNTER — Ambulatory Visit (INDEPENDENT_AMBULATORY_CARE_PROVIDER_SITE_OTHER): Payer: BC Managed Care – PPO | Admitting: Family Medicine

## 2010-07-23 DIAGNOSIS — M542 Cervicalgia: Secondary | ICD-10-CM

## 2010-07-23 DIAGNOSIS — M62838 Other muscle spasm: Secondary | ICD-10-CM

## 2010-07-23 MED ORDER — TIZANIDINE HCL 4 MG PO TABS: 4.0000 mg | ORAL_TABLET | Freq: Every evening | ORAL | Status: AC

## 2010-07-23 MED ORDER — DICLOFENAC SODIUM 75 MG PO TBEC: 75.0000 mg | DELAYED_RELEASE_TABLET | Freq: Two times a day (BID) | ORAL | Status: DC

## 2010-07-23 NOTE — Progress Notes (Signed)
Ellen [name], a 40 y.o. female presents today in the office for the following:    He pat three weeks will have some pain in her neck posterior and when waking up in the morning.  Upper neck and pain in her L > R.  Subjective:     Ellen [name] is a 40 y.o. female who presents for evaluation of neck pain. Event that precipitated these symptoms: none known. Onset of symptoms was 2-3 weeks ago, and have been unchanged since that time. Current symptoms are pain in posterior neck (aching and dull in character; 5/10 in severity) and stiffness in posterior neck. Patient denies numbness in any location and paresthesias in any location. Patient has had no prior neck problems. Previous treatments: medication: NSAID: ibuprofen.  Also c/o R 5th toe pain, h/o trauma, son stepped on foot about 2 months ago  The PMH, PSH, Social History, Family History, Medications, and allergies have been reviewed in [facility], and have been updated if relevant.  Review of Systems REVIEW OF SYSTEMS  GEN: No fevers, chills. Nontoxic. Primarily MSK c/o today. MSK: Detailed in the HPI GI: tolerating PO intake without difficulty Neuro: No numbness, parasthesias, or tingling associated. Otherwise the pertinent positives of the ROS are noted above.     Objective:    BP 140/90  Pulse 95  Temp(Src) 98.5 F (36.9 C) (Oral)  Ht 5\' 3"  (1.6 m)  Wt 157 lb 6.4 oz (71.396 kg)  BMI 27.88 kg/m2  SpO2 98% General:   alert, cooperative and appears stated age  External Deformity:  absent  ROM Cervical Spine:  supple and limited no limitation (minimal limitation)  Midline Tenderness:  absent midline  Paraspinous tenderness:  mild r and l  UE Neurologic Exam:  normal strength, normal sensation  R toe, small nodularity, midshaft, minimally tender  X-ray of the cervical spine: Not indicated    Assessment:    Cervical pain  Muscle spasm, neck Toe pain - cannot exclude old fracture. Minimal to be done now. Wider toe box,  padding in shoe.   Plan:    Discussed the cervical pain, its course and treatment. Agricultural engineer distributed. Discussed appropriate exercises. Discussed appropriate use of ice and heat. NSAIDs per medication orders. Muscle relaxants started per medication orders.

## 2010-10-31 LAB — CBC
HCT: 29.9 — ABNORMAL LOW
MCHC: 32.5
MCV: 81.7
Platelets: 115 — ABNORMAL LOW
Platelets: 137 — ABNORMAL LOW
RDW: 16.6 — ABNORMAL HIGH
RDW: 16.9 — ABNORMAL HIGH

## 2010-10-31 LAB — RPR: RPR Ser Ql: NONREACTIVE

## 2010-11-18 ENCOUNTER — Ambulatory Visit (INDEPENDENT_AMBULATORY_CARE_PROVIDER_SITE_OTHER): Payer: BC Managed Care – PPO | Admitting: Family Medicine

## 2010-11-18 ENCOUNTER — Encounter: Payer: Self-pay | Admitting: Family Medicine

## 2010-11-18 DIAGNOSIS — M795 Residual foreign body in soft tissue: Secondary | ICD-10-CM | POA: Insufficient documentation

## 2010-11-18 NOTE — Progress Notes (Signed)
  Subjective:    Patient ID: Ellen [name], female    DOB: 1970-06-16, 40 y.o.   MRN: [id number]  HPI Pt of Dr Royden Purl here as acute appt for a splinter in her finger since Sun which she tried to remove herself and feels she pushed it in further.  She has been worried because her sister got a significant infection from one of these. She has been a gestational diabetic but not overt diabetic. Shwe denies fever or chills, pain in the site of the finger.   Review of Systems Noncontributory except as above.      Objective:   Physical Examright ring finger tuff with open small wound, slightly probed with 22G needle, no FB seen. Neosporin and bandaid applied.        Assessment & Plan:  Soak in hot water 4 times a day, including today. If pain, inflammation or pus ensues, come back to let me reeval but continue the soaks until seen.

## 2010-11-18 NOTE — Assessment & Plan Note (Signed)
Come back if sxs worsen. Keep clean and dry until heals otherwise.

## 2010-11-18 NOTE — Patient Instructions (Signed)
Hot soaks as described four times a day until healed. Call if sxs worsen.

## 2010-12-26 ENCOUNTER — Ambulatory Visit: Payer: BC Managed Care – PPO | Admitting: Family Medicine

## 2011-01-14 ENCOUNTER — Ambulatory Visit (INDEPENDENT_AMBULATORY_CARE_PROVIDER_SITE_OTHER): Payer: BC Managed Care – PPO | Admitting: Family Medicine

## 2011-01-14 ENCOUNTER — Encounter: Payer: Self-pay | Admitting: Family Medicine

## 2011-01-14 VITALS — BP 130/84 | HR 95 | Temp 98.0°F | Ht 63.0 in | Wt 155.2 lb

## 2011-01-14 DIAGNOSIS — M5412 Radiculopathy, cervical region: Secondary | ICD-10-CM

## 2011-01-14 MED ORDER — DICLOFENAC SODIUM 75 MG PO TBEC: 75.0000 mg | DELAYED_RELEASE_TABLET | Freq: Two times a day (BID) | ORAL | Status: DC

## 2011-01-14 MED ORDER — CYCLOBENZAPRINE HCL 10 MG PO TABS: 10.0000 mg | ORAL_TABLET | Freq: Three times a day (TID) | ORAL | Status: AC | PRN

## 2011-01-14 MED ORDER — PREDNISONE 20 MG PO TABS: ORAL_TABLET | ORAL | Status: DC

## 2011-01-14 NOTE — Patient Instructions (Signed)
Look up on Youtube  McKenzie Protocol Cervical Spine  

## 2011-01-14 NOTE — Progress Notes (Signed)
  Patient Name: Ellen [name] Date of Birth: 1970/04/05 Age: 40 y.o. Medical Record Number: [id number] Gender: female  History of Present Illness:  Ellen [name] is a 40 y.o. very pleasant female patient who presents with the following:  Patient presents with 2 days history of bilateral upper extremity neuropathic radicular pain and pain in her shoulder blades down through her shoulders and arms all the way to her fingertips. This is bilateral, but RIGHT is greater than LEFT. She has no known injury or trauma. It does seem to be exacerbated when she is working at her computer. She has had some neck pain in the past, but no radicular pain or neuropathic symptoms such as these in the past.  Monday, arm was hurting her a lot. Has taken some motrin, started in the arms A sharp pain, some pain in the wrist and also tingling and sharp pain in the shoulder.  Bad arm pain on Monday.   Past Medical History, Surgical History, Social History, Family History, and Problem List have been reviewed in EHR and updated if relevant.  Review of Systems:  GEN: No fevers, chills. Nontoxic. Primarily MSK c/o today. MSK: Detailed in the HPI GI: tolerating PO intake without difficulty Neuro: detailed above Otherwise the pertinent positives of the ROS are noted above.   Physical Examination: Filed Vitals:   01/14/11 1217  BP: 130/84  Pulse: 95  Temp: 98 F (36.7 C)  TempSrc: Oral  Height: 5\' 3"  (1.6 m)  Weight: 155 lb 4 oz (70.421 kg)    Body mass index is 27.50 kg/(m^2).   GEN: Well-developed,well-nourished,in no acute distress; alert,appropriate and cooperative throughout examination HEENT: Normocephalic and atraumatic without obvious abnormalities. Ears, externally no deformities PULM: Breathing comfortably in no respiratory distress EXT: No clubbing, cyanosis, or edema PSYCH: Normally interactive. Cooperative during the interview. Pleasant. Friendly and conversant. Not anxious or  depressed appearing. Normal, full affect.  CERVICAL SPINE EXAM Range of motion: Flexion, extension, lateral bending, and rotation: preserved Pain with terminal motion: no Spinous Processes: NT SCM: NT Upper paracervical muscles: nt Upper traps: NT C5-T1 intact, sensation and motor  Range of motion of the elbow and wrist is preserved. No tenosynovitis or bogginess at the hands or wrists. Tinel's and Phalen's are perfectly normal and do not provoke any additional symptoms in either hand. Tinel's at the elbow is also negative.  Assessment and Plan: Cervical radiculopathy. By history, the patient has diffuse upper extremity radiculopathy and neuropathic symptoms. Radiating down from her shoulder blades all the way to bilateral extremities. This would be most consistent with I nerve impingement at the neck. It is not provokable in the neck, elbow, or wrists. The patient wanted to know if this was consistent with carpal tunnel syndrome, but that would follow a different pattern of symptoms and classically would have reproducible symptoms on exam.  For now, symptoms have been present for 2 days, I will put her on a short prednisone dose, Flexeril at nighttime, and then place her on diclofenac after prednisone is complete. If symptoms persist, then further eval is indicated.

## 2011-06-22 ENCOUNTER — Other Ambulatory Visit: Payer: Self-pay | Admitting: Obstetrics and Gynecology

## 2011-08-07 ENCOUNTER — Telehealth: Payer: Self-pay

## 2011-08-07 MED ORDER — SCOPOLAMINE 1 MG/3DAYS TD PT72: 1.0000 | MEDICATED_PATCH | TRANSDERMAL | Status: DC

## 2011-08-07 NOTE — Telephone Encounter (Signed)
Pt left v/m requesting scopolamine patch for motion sickness to [facility] [address]. I tried to reach pt at contact # no answer and no v/m. Do not know how long pt will be cruising, flying etc. Please advise.

## 2011-08-07 NOTE — Telephone Encounter (Signed)
Will just send for short supply then Will refill electronically

## 2011-08-11 NOTE — Telephone Encounter (Signed)
Is this completed so encounter can be closed?

## 2011-09-25 ENCOUNTER — Ambulatory Visit (INDEPENDENT_AMBULATORY_CARE_PROVIDER_SITE_OTHER): Payer: BC Managed Care – PPO | Admitting: Family Medicine

## 2011-09-25 ENCOUNTER — Encounter: Payer: Self-pay | Admitting: Family Medicine

## 2011-09-25 VITALS — BP 116/68 | HR 82 | Temp 98.6°F | Ht 64.0 in | Wt 156.8 lb

## 2011-09-25 DIAGNOSIS — R109 Unspecified abdominal pain: Secondary | ICD-10-CM

## 2011-09-25 DIAGNOSIS — L219 Seborrheic dermatitis, unspecified: Secondary | ICD-10-CM | POA: Insufficient documentation

## 2011-09-25 DIAGNOSIS — R140 Abdominal distension (gaseous): Secondary | ICD-10-CM

## 2011-09-25 LAB — POCT URINALYSIS DIPSTICK
Glucose, UA: NEGATIVE
Leukocytes, UA: NEGATIVE
Nitrite, UA: NEGATIVE
Urobilinogen, UA: 0.2

## 2011-09-25 MED ORDER — FLUTICASONE PROPIONATE 50 MCG/ACT NA SUSP: 2.0000 | Freq: Every day | NASAL | Status: DC

## 2011-09-25 MED ORDER — MOMETASONE FUROATE 0.1 % EX CREA: TOPICAL_CREAM | Freq: Every day | CUTANEOUS | Status: AC

## 2011-09-25 NOTE — Progress Notes (Signed)
Subjective:    Patient ID: Ellen [name], female    DOB: [birth year], 41 y.o.   MRN: [id number]  HPI Here for GI discomfort  Has had bloating for a number of years -- since her 72s Found out this is common and can come for another conditions   Most of her bloating is lower abd Lot of gas  occ constipation- not a lot  Usually has a bm every 1-2 days , no straining or hard stool, no blood in stool  Does have a hx of lactose intolerance No milk in diet  Does eat cheese occas- feels a difference   No diarrhea  No n/v Does not tend to get heartburn   Bloating is worse before her cycle  Will occ take ibuprofen because this will be painful crampy sharp pains    Sometimes probiotics do give her some relief  otc align   Also mild rash on face L side of chin Scales/ occ white bumps Some itch Steroid creams otc -- ? If has tried  Tried moisturizers  Another brand - [employer] - helping a little   Worse with carbs , pasta, rice   Has not tried gluten free diet yet   Patient Active Problem List  Diagnosis  . HYPERLIPIDEMIA  . COMMON MIGRAINE  . SINUSITIS, CHRONIC  . ALLERGIC RHINITIS  . ALOPECIA  . HYPERGLYCEMIA  . DIABETES MELLITUS, GESTATIONAL, HX OF  . GANGLION CYST, WRIST, LEFT  . Foreign body (FB) in soft tissue of left little finger   Past Medical History  Diagnosis Date  . Allergic rhinitis due to pollen   . Hyperlipidemia   . DM (diabetes mellitus), gestational   . 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 Migraine    Past Surgical History  Procedure Date  . Pilonidal cyst excision   . Uterine fibroid surgery   . Hystosalpingogram    History  Substance Use Topics  . Smoking status: Never Smoker   . Smokeless tobacco: Never Used  . Alcohol Use: No   Family History  Problem Relation Age of Onset  . Cancer Mother     liver  . Diabetes Father   . Heart disease Father   . Migraines Sister   . Migraines Sister   . Migraines Sister     No Known Allergies Current Outpatient Prescriptions on File Prior to Visit  Medication Sig Dispense Refill  . cetirizine (ZYRTEC) 10 MG tablet Take 10 mg by mouth daily.      . fluticasone (FLONASE) 50 MCG/ACT nasal spray Place 2 sprays into the nose daily.        . Multiple Vitamin (MULTIVITAMIN) capsule Take 1 capsule by mouth daily.        . diclofenac (VOLTAREN) 75 MG EC tablet Take 1 tablet (75 mg total) by mouth 2 (two) times daily.  60 tablet  3  . fexofenadine (ALLEGRA) 180 MG tablet Take 180 mg by mouth daily.        . predniSONE (DELTASONE) 20 MG tablet 2 tabs po daily for 5 days, then 1 po daily for 2 days  12 tablet  0  . PREVIDENT 5000 SENSITIVE 1.1-5 % PSTE       . scopolamine (TRANSDERM-SCOP) 1.5 MG Place 1 patch (1.5 mg total) onto the skin every 3 (three) days. Careful of sedation  5 patch  0       Review of Systems Review of Systems  Constitutional: Negative for  fever, appetite change, fatigue and unexpected weight change.  Eyes: Negative for pain and visual disturbance.  Respiratory: Negative for cough and shortness of breath.   Cardiovascular: Negative for cp or palpitations    Gastrointestinal: Negative for nausea, diarrhea and constipation. neg for blood in stool or dark stools Genitourinary: Negative for urgency and frequency.  Skin: Negative for pallor and pos for rash Neurological: Negative for weakness, light-headedness, numbness and headaches.  Hematological: Negative for adenopathy. Does not bruise/bleed easily.  Psychiatric/Behavioral: Negative for dysphoric mood. The patient is not nervous/anxious.         Objective:   Physical Exam  Constitutional: She appears well-developed and well-nourished. No distress.  HENT:  Head: Normocephalic and atraumatic.  Mouth/Throat: Oropharynx is clear and moist.  Eyes: Conjunctivae and EOM are normal. Pupils are equal, round, and reactive to light.  Neck: Normal range of motion. Neck supple. No tracheal  deviation present. No thyromegaly present.  Cardiovascular: Normal rate, regular rhythm and normal heart sounds.  Exam reveals no gallop.   Pulmonary/Chest: Effort normal and breath sounds normal. No respiratory distress. She has no wheezes.  Abdominal: Soft. Bowel sounds are normal. She exhibits no shifting dullness, no distension, no pulsatile liver, no abdominal bruit, no ascites and no mass. There is no hepatosplenomegaly. There is no tenderness. There is no rebound and no guarding.       Abdomen is protuberant but not distended  nontender   Musculoskeletal: She exhibits no edema and no tenderness.  Lymphadenopathy:    She has no cervical adenopathy.  Neurological: She is alert. She has normal reflexes. No cranial nerve deficit. She exhibits normal muscle tone. Coordination normal.  Skin: Skin is warm and dry. No rash noted. No erythema. No pallor.  Psychiatric: She has a normal mood and affect.          Assessment & Plan:

## 2011-09-25 NOTE — Assessment & Plan Note (Signed)
Ongoing and likely IBS but pt is wondering about poss of inflam BD, celiac or other problems Adv to continue probiotic and also avoid dairy  Will ref to GI for further eval

## 2011-09-25 NOTE — Assessment & Plan Note (Signed)
On face  Disc cleanser used  Will try elocon cream small amt to aff area and update if not imp in several weeks Could consider nizoral if not imp

## 2011-09-25 NOTE — Patient Instructions (Addendum)
We will do a referral to GI for bloating  Continue probiotics if they help  Avoid dairy  For the rash on face - try elicon cream  If no imp in 1-2 weeks , let me know

## 2011-10-20 ENCOUNTER — Encounter: Payer: Self-pay | Admitting: Gastroenterology

## 2011-10-20 ENCOUNTER — Other Ambulatory Visit (INDEPENDENT_AMBULATORY_CARE_PROVIDER_SITE_OTHER): Payer: BC Managed Care – PPO

## 2011-10-20 ENCOUNTER — Ambulatory Visit (INDEPENDENT_AMBULATORY_CARE_PROVIDER_SITE_OTHER): Payer: BC Managed Care – PPO | Admitting: Gastroenterology

## 2011-10-20 VITALS — BP 120/72 | HR 88 | Ht 63.0 in | Wt 155.0 lb

## 2011-10-20 DIAGNOSIS — R141 Gas pain: Secondary | ICD-10-CM

## 2011-10-20 DIAGNOSIS — IMO0001 Reserved for inherently not codable concepts without codable children: Secondary | ICD-10-CM

## 2011-10-20 DIAGNOSIS — R197 Diarrhea, unspecified: Secondary | ICD-10-CM

## 2011-10-20 DIAGNOSIS — R140 Abdominal distension (gaseous): Secondary | ICD-10-CM

## 2011-10-20 DIAGNOSIS — E739 Lactose intolerance, unspecified: Secondary | ICD-10-CM

## 2011-10-20 DIAGNOSIS — R143 Flatulence: Secondary | ICD-10-CM

## 2011-10-20 LAB — CBC WITH DIFFERENTIAL/PLATELET
Basophils Relative: 0.6 % (ref 0.0–3.0)
Eosinophils Relative: 5.1 % — ABNORMAL HIGH (ref 0.0–5.0)
HCT: 40.2 % (ref 36.0–46.0)
Lymphs Abs: 1.9 10*3/uL (ref 0.7–4.0)
MCV: 80 fl (ref 78.0–100.0)
Monocytes Absolute: 0.4 10*3/uL (ref 0.1–1.0)
Platelets: 240 10*3/uL (ref 150.0–400.0)
RBC: 5.01 Mil/uL (ref 3.87–5.11)
WBC: 5.4 10*3/uL (ref 4.5–10.5)

## 2011-10-20 LAB — HEPATIC FUNCTION PANEL
AST: 18 U/L (ref 0–37)
Total Bilirubin: 0.4 mg/dL (ref 0.3–1.2)

## 2011-10-20 LAB — BASIC METABOLIC PANEL
BUN: 13 mg/dL (ref 6–23)
GFR: 95.32 mL/min (ref >60.00)
Potassium: 3.5 mEq/L (ref 3.5–5.1)

## 2011-10-20 NOTE — Patient Instructions (Addendum)
Your physician has requested that you go to the basement for the following lab work before leaving today. You also have been given a lactose diet and information on artificial sweeteners to follow. Also schedule an appointment to see Dr. Jarold Motto again in 2 to 3 months.  CC: [place], M. D.

## 2011-10-20 NOTE — Progress Notes (Signed)
History of Present Illness:  This is a 41 year old African American female who has had abdominal gas, bloating, and lower nominal discomfort since age 63. She has regular bowel movements and denies melena or hematochezia. She does have known lactose intolerance but continues to use these foods, also uses a large amount of by mouth sorbitol with diet gone. She complains of abdominal gas, bloating, and distention and occasional crampy abdominal pain. There is no history of acid reflux, dysphagia, or specific hepatobiliary complaints. The patient has not had previous GI evaluations, x-rays, or endoscopic exams. There is a family history of colon cancer in her great aunt. She is followed closely by GYN per fibroids in her uterus, and apparently has yearly ultrasounds.  I have reviewed this patient's present history, medical and surgical past history, allergies and medications.     ROS: The remainder of the 10 point ROS is negative     Physical Exam: Blood pressure 116/68, pulse 82 and regular, and weight 156 pounds with BMI of 26.91. General well developed well nourished patient in no acute distress, appearing their stated age Eyes PERRLA, no icterus, fundoscopic exam per opthamologist Skin no lesions noted Neck supple, no adenopathy, no thyroid enlargement, no tenderness Chest clear to percussion and auscultation Heart no significant murmurs, gallops or rubs noted Abdomen no hepatosplenomegaly masses or tenderness, BS normal.  Rectal inspection normal no fissures, or fistulae noted.  No masses or tenderness on digital exam. Stool guaiac negative. Extremities no acute joint lesions, edema, phlebitis or evidence of cellulitis. Neurologic patient oriented x 3, cranial nerves intact, no focal neurologic deficits noted. Psychological mental status normal and normal affect.  Assessment and plan: This patient has lactose intolerance and also malabsorption of other nonabsorbable carbohydrates. I have  placed her on a lactose-free diet with avoidance of sorbitol and fructose also. Labs drawn for review including celiac profile. I will see her back in 6 weeks' time for followup. She may need colonoscopy exams.  Encounter Diagnoses  Name Primary?  . Diarrhea Yes  . Gas   . Bloating

## 2011-10-21 LAB — CELIAC PANEL 10
Endomysial Screen: NEGATIVE
Gliadin IgA: 3.8 U/mL (ref <20)
Gliadin IgG: 5.2 U/mL (ref <20)
IgA: 369 mg/dL (ref 69–380)
Tissue Transglutaminase Ab, IgA: 5.2 U/mL (ref <20)

## 2011-10-23 ENCOUNTER — Telehealth: Payer: Self-pay | Admitting: Gastroenterology

## 2011-10-23 DIAGNOSIS — E611 Iron deficiency: Secondary | ICD-10-CM

## 2011-10-23 MED ORDER — FERROUS FUM-IRON POLYSACCH-FA 162-115.2-1 MG PO CAPS: 1.0000 | ORAL_CAPSULE | Freq: Every day | ORAL | Status: DC

## 2011-10-23 NOTE — Telephone Encounter (Signed)
Informed pt her labs are normal except for her iron sat; Dr Jarold Motto would like for her to taken Tandem for 3 months and do IFOB cards. Pt stated understanding.

## 2011-10-23 NOTE — Telephone Encounter (Signed)
Needs IFOB and tandem for 3 mos,,,probaly related to menses.

## 2011-10-23 NOTE — Telephone Encounter (Signed)
Dr Jarold Motto, can you review labs, Iron Sat, so I can inform the pt? Thanks,

## 2011-11-24 ENCOUNTER — Telehealth: Payer: Self-pay | Admitting: *Deleted

## 2011-11-24 NOTE — Telephone Encounter (Signed)
Message copied by Florene Glen on Tue Nov 24, 2011  3:27 PM ------      Message from: Florene Glen      Created: Fri Oct 23, 2011 11:18 AM       Look for ifob results

## 2011-12-24 NOTE — Telephone Encounter (Signed)
Letter mailed to the patient asking her to return hemoccult cards

## 2012-02-05 ENCOUNTER — Ambulatory Visit (INDEPENDENT_AMBULATORY_CARE_PROVIDER_SITE_OTHER): Payer: BC Managed Care – PPO | Admitting: Family Medicine

## 2012-02-05 ENCOUNTER — Encounter: Payer: Self-pay | Admitting: Family Medicine

## 2012-02-05 VITALS — BP 168/98 | HR 93 | Temp 99.4°F | Wt 157.0 lb

## 2012-02-05 DIAGNOSIS — J069 Acute upper respiratory infection, unspecified: Secondary | ICD-10-CM

## 2012-02-05 DIAGNOSIS — J029 Acute pharyngitis, unspecified: Secondary | ICD-10-CM

## 2012-02-05 NOTE — Progress Notes (Signed)
RST neg.    Sx started about 5 days ago.  Took tylenol earlier in the week.  Still with ST and enlarged lymph nodes.  Changed to ibuprofen with temporary relief.  No fevers, ie temp >100.4.  Occ cough and some HA.  No sick contacts.   B ear aches.   Fatigued, aching. Rhinorrhea.  Discolored rhinorrhea and postnasal gtt.  Aches noted today.    Meds, vitals, and allergies reviewed.   ROS: See HPI.  Otherwise, noncontributory.  GEN: nad, alert and oriented HEENT: mucous membranes moist, tm w/o erythema, nasal exam w/o erythema, clear discharge noted,  OP with cobblestoning NECK: supple w/o large tender LNs CV: rrr.   PULM: ctab, no inc wob EXT: no edema SKIN: no acute rash  OP cx pending

## 2012-02-05 NOTE — Patient Instructions (Addendum)
We'll contact you with your lab report. Take up to 3 of the OTC ibuprofen tabs (200mg /tab) up to 3 times a day with food.  Gargle with warm salt water frequently and rest your voice.   Drink plenty of fluids.   Take care.

## 2012-02-07 ENCOUNTER — Encounter: Payer: Self-pay | Admitting: Family Medicine

## 2012-02-07 DIAGNOSIS — J069 Acute upper respiratory infection, unspecified: Secondary | ICD-10-CM | POA: Insufficient documentation

## 2012-02-07 NOTE — Assessment & Plan Note (Signed)
Nontoxic, had a flu shot.  Would check strep culture given the h/o lymph node enlargement but if this is neg then likely viral and should resolve.  Supportive care o/w, d/w pt.  She agrees.

## 2012-02-08 ENCOUNTER — Encounter: Payer: Self-pay | Admitting: *Deleted

## 2012-05-04 ENCOUNTER — Encounter: Payer: Self-pay | Admitting: Family Medicine

## 2012-05-04 ENCOUNTER — Ambulatory Visit (INDEPENDENT_AMBULATORY_CARE_PROVIDER_SITE_OTHER): Payer: BC Managed Care – PPO | Admitting: Family Medicine

## 2012-05-04 VITALS — BP 140/90 | HR 84 | Temp 98.8°F | Ht 63.0 in | Wt 154.8 lb

## 2012-05-04 DIAGNOSIS — M25512 Pain in left shoulder: Secondary | ICD-10-CM

## 2012-05-04 DIAGNOSIS — R030 Elevated blood-pressure reading, without diagnosis of hypertension: Secondary | ICD-10-CM

## 2012-05-04 DIAGNOSIS — M25519 Pain in unspecified shoulder: Secondary | ICD-10-CM

## 2012-05-04 DIAGNOSIS — D509 Iron deficiency anemia, unspecified: Secondary | ICD-10-CM

## 2012-05-04 MED ORDER — MELOXICAM 15 MG PO TABS: 15.0000 mg | ORAL_TABLET | Freq: Every day | ORAL | Status: DC

## 2012-05-04 MED ORDER — FERROUS FUM-IRON POLYSACCH-FA 162-115.2-1 MG PO CAPS: 1.0000 | ORAL_CAPSULE | Freq: Every day | ORAL | Status: DC

## 2012-05-04 NOTE — Assessment & Plan Note (Signed)
Suspect from pseudoephedrine -will stop that and get it re checked at her gyn office  No problems Does have family hx

## 2012-05-04 NOTE — Assessment & Plan Note (Signed)
Suspect tendonitis- recurrent consv tx with mobic 15 mg and relative rest/ ice  If no timp 1-2 weeks will make appt with dr Patsy Lager for eval and poss injection

## 2012-05-04 NOTE — Assessment & Plan Note (Signed)
From menses Refilled iron  Will get labs in May with gyn

## 2012-05-04 NOTE — Progress Notes (Signed)
Subjective:    Patient ID: Ellen [name], female    DOB: 03/03/1970, 42 y.o.   MRN: [id number]  HPI Here for L arm/ shoulder pain  Also wants to check her iron for anemia  This has happened before in 2010 (she went to urgent care and got a cortisone shot in arm) Going on for about 1 month- hurts to abduct arm or lift things No injury noted  Pain is sharp in nature and throbs at night in bed  No redness or swelling   Is R handed  She does carry work bag on L shoulder-not too heavy   Has not been taking anything for this No heat or ice   Is supposed to be on iron - she lost her px  Needs a refil - was originally from gyn Has f/u in may   bp is high today Has been taking zyrtec D No headache    Patient Active Problem List  Diagnosis  . HYPERLIPIDEMIA  . COMMON MIGRAINE  . SINUSITIS, CHRONIC  . ALLERGIC RHINITIS  . ALOPECIA  . HYPERGLYCEMIA  . DIABETES MELLITUS, GESTATIONAL, HX OF  . GANGLION CYST, WRIST, LEFT  . Foreign body (FB) in soft tissue of left little finger  . Abdominal bloating  . Seborrheic dermatitis   Past Medical History  Diagnosis Date  . Allergic rhinitis due to pollen   . Hyperlipidemia   . DM (diabetes mellitus), gestational   . 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 Migraine   . Seborrheic dermatitis    Past Surgical History  Procedure Laterality Date  . Pilonidal cyst excision    . Uterine fibroid surgery    . Hystosalpingogram    . Cesarean section      x 2    History  Substance Use Topics  . Smoking status: Never Smoker   . Smokeless tobacco: Never Used  . Alcohol Use: No   Family History  Problem Relation Age of Onset  . Liver cancer Mother   . Diabetes Father   . Heart disease Father   . Migraines Sister   . Migraines Sister   . Migraines Sister   . Colon cancer Neg Hx    No Known Allergies Current Outpatient Prescriptions on File Prior to Visit  Medication Sig Dispense Refill  . cetirizine (ZYRTEC) 10  MG tablet Take 10 mg by mouth daily.      . fluticasone (FLONASE) 50 MCG/ACT nasal spray Place 2 sprays into the nose daily.  16 g  11  . mometasone (ELOCON) 0.1 % cream Apply topically daily. To affected area  15 g  0  . Multiple Vitamin (MULTIVITAMIN) capsule Take 1 capsule by mouth daily.        . Ferrous Fum-Iron Polysacch-FA (TANDEM F) 162-115.2-1 MG CAPS Take 1 capsule by mouth daily.  30 each  3   No current facility-administered medications on file prior to visit.       Review of Systems Review of Systems  Constitutional: Negative for fever, appetite change, and unexpected weight change. pos for fatigue from anemia  Eyes: Negative for pain and visual disturbance.  Respiratory: Negative for cough and shortness of breath.   Cardiovascular: Negative for cp or palpitations    Gastrointestinal: Negative for nausea, diarrhea and constipation.  Genitourinary: Negative for urgency and frequency.  Skin: Negative for pallor or rash   MSK pos for shoulder/arm pain/ neg for other joint pain or swelling  Neurological: Negative  for weakness, light-headedness, numbness and headaches.  Hematological: Negative for adenopathy. Does not bruise/bleed easily.  Psychiatric/Behavioral: Negative for dysphoric mood. The patient is not nervous/anxious.         Objective:   Physical Exam  Constitutional: She appears well-developed and well-nourished. No distress.  HENT:  Head: Normocephalic and atraumatic.  Mouth/Throat: Oropharynx is clear and moist.  Eyes: Conjunctivae and EOM are normal. Pupils are equal, round, and reactive to light. Right eye exhibits no discharge. Left eye exhibits no discharge. No scleral icterus.  Neck: Normal range of motion. Neck supple. No JVD present. Carotid bruit is not present. No thyromegaly present.  Cardiovascular: Normal rate, regular rhythm, normal heart sounds and intact distal pulses.  Exam reveals no gallop.   Pulmonary/Chest: Effort normal and breath sounds  normal. No respiratory distress. She has no wheezes.  Abdominal: Soft. Bowel sounds are normal. She exhibits no distension, no abdominal bruit and no mass. There is no tenderness.  No suprapubic tenderness or fullness    Musculoskeletal: She exhibits tenderness. She exhibits no edema.       Left shoulder: She exhibits decreased range of motion and tenderness. She exhibits no bony tenderness, no swelling, no effusion, no crepitus, no deformity, normal pulse and normal strength.  L shoulder- tender over deltoid area  No swelling/ redness Can abduct almost 90 deg, other rom normal Int / ext rot nl  hawkings nl , neer test pos for ant shoulder pain  Nl grip    Lymphadenopathy:    She has no cervical adenopathy.  Neurological: She is alert. She has normal reflexes. No cranial nerve deficit. She exhibits normal muscle tone. Coordination normal.  Skin: Skin is warm and dry. No rash noted. No erythema. No pallor.  Psychiatric: She has a normal mood and affect.          Assessment & Plan:

## 2012-05-04 NOTE — Patient Instructions (Addendum)
Stop the zyrtec D and take plain zyrtec - the D is raising blood pressure too high  use flonase and nasal saline spray for congestion  For arm pain - take meloxicam for 2 weeks with food once daily and use ice on your arm  If no improvement then - call for appt with Dr Patsy Lager (our sports med doctor here) Start back on iron and have gyn check levels in May

## 2012-06-01 ENCOUNTER — Telehealth: Payer: Self-pay | Admitting: Family Medicine

## 2012-06-01 NOTE — Telephone Encounter (Signed)
Her last B12 level here was actually in the high range - so she does not need additional B12

## 2012-06-01 NOTE — Telephone Encounter (Signed)
Pt was calling about a B-12 injection and I explained the [place] wide shortage of B-12 to her. She says that some dr friends of hers have reccommended the B-12 for better absorption of the iron you gave her and also b/c she stays so tired. She wants to know if she should take OTC B-12 by mouth and if so, how much? Thank you.

## 2012-06-02 NOTE — Telephone Encounter (Signed)
Pt notified b12 was high last check so no add b12 needed, pt verbalized understanding

## 2012-06-23 ENCOUNTER — Other Ambulatory Visit: Payer: Self-pay | Admitting: Obstetrics and Gynecology

## 2012-07-01 ENCOUNTER — Other Ambulatory Visit: Payer: Self-pay | Admitting: Obstetrics and Gynecology

## 2012-07-01 DIAGNOSIS — R928 Other abnormal and inconclusive findings on diagnostic imaging of breast: Secondary | ICD-10-CM

## 2012-07-08 ENCOUNTER — Ambulatory Visit
Admission: RE | Admit: 2012-07-08 | Discharge: 2012-07-08 | Disposition: A | Discharge status: Home / Self Care | Payer: BC Managed Care – PPO | Source: Ambulatory Visit | Attending: Obstetrics and Gynecology | Admitting: Obstetrics and Gynecology

## 2012-07-08 DIAGNOSIS — R928 Other abnormal and inconclusive findings on diagnostic imaging of breast: Secondary | ICD-10-CM

## 2012-12-09 ENCOUNTER — Ambulatory Visit (INDEPENDENT_AMBULATORY_CARE_PROVIDER_SITE_OTHER): Payer: BC Managed Care – PPO | Admitting: Family Medicine

## 2012-12-09 ENCOUNTER — Encounter: Payer: Self-pay | Admitting: Family Medicine

## 2012-12-09 VITALS — BP 116/68 | HR 80 | Temp 98.4°F | Ht 63.0 in | Wt 160.0 lb

## 2012-12-09 DIAGNOSIS — R071 Chest pain on breathing: Secondary | ICD-10-CM

## 2012-12-09 DIAGNOSIS — R0789 Other chest pain: Secondary | ICD-10-CM

## 2012-12-09 MED ORDER — MELOXICAM 15 MG PO TABS: 15.0000 mg | ORAL_TABLET | Freq: Every day | ORAL | Status: DC

## 2012-12-09 MED ORDER — CYCLOBENZAPRINE HCL 10 MG PO TABS: 10.0000 mg | ORAL_TABLET | Freq: Every evening | ORAL | Status: DC | PRN

## 2012-12-09 NOTE — Progress Notes (Signed)
Pre-visit discussion using our clinic review tool. No additional management support is needed unless otherwise documented below in the visit note.  

## 2012-12-09 NOTE — Progress Notes (Signed)
Subjective:    Patient ID: Ellen [name], female    DOB: 19-May-1970, 42 y.o.   MRN: [id number]  HPI Here for pain under her L arm   Woke up with it yesterday am- thought she had pulled a muscle under her arm Is tender and sore  Took 800 mg of ibuprofen- and a little improvement  Worse if the turns right or leans forward Back and neck feel ok   No fever or cough  Never had this before  No n/v   Patient Active Problem List   Diagnosis Date Noted  . Left shoulder pain 05/04/2012  . Anemia, iron deficiency 05/04/2012  . Elevated blood pressure (not hypertension) 05/04/2012  . Abdominal bloating 09/25/2011  . Seborrheic dermatitis 09/25/2011  . GANGLION CYST, WRIST, LEFT 03/03/2010  . COMMON MIGRAINE 06/22/2008  . HYPERGLYCEMIA 02/15/2008  . DIABETES MELLITUS, GESTATIONAL, HX OF 02/15/2008  . HYPERLIPIDEMIA 10/01/2006  . SINUSITIS, CHRONIC 10/01/2006  . ALLERGIC RHINITIS 10/01/2006  . ALOPECIA 10/01/2006   Past Medical History  Diagnosis Date  . Allergic rhinitis due to pollen   . Hyperlipidemia   . DM (diabetes mellitus), gestational   . 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 Migraine   . Seborrheic dermatitis    Past Surgical History  Procedure Laterality Date  . Pilonidal cyst excision    . Uterine fibroid surgery    . Hystosalpingogram    . Cesarean section      x 2    History  Substance Use Topics  . Smoking status: Never Smoker   . Smokeless tobacco: Never Used  . Alcohol Use: No   Family History  Problem Relation Age of Onset  . Liver cancer Mother   . Diabetes Father   . Heart disease Father   . Migraines Sister   . Migraines Sister   . Migraines Sister   . Colon cancer Neg Hx    No Known Allergies Current Outpatient Prescriptions on File Prior to Visit  Medication Sig Dispense Refill  . fluticasone (FLONASE) 50 MCG/ACT nasal spray Place 2 sprays into the nose daily.  16 g  11   No current facility-administered medications on  file prior to visit.      Review of Systems Review of Systems  Constitutional: Negative for fever, appetite change, fatigue and unexpected weight change.  Eyes: Negative for pain and visual disturbance.  Respiratory: Negative for cough and shortness of breath.   Cardiovascular: Negative for palpitations, sob, sweating, pnd, orthopnea, pedal edema or leg pain, neg for exertional symptoms     Gastrointestinal: Negative for nausea, diarrhea and constipation.  Genitourinary: Negative for urgency and frequency.  Skin: Negative for pallor or rash   Neurological: Negative for weakness, light-headedness, numbness and headaches.  Hematological: Negative for adenopathy. Does not bruise/bleed easily.  Psychiatric/Behavioral: Negative for dysphoric mood. The patient is not nervous/anxious.         Objective:   Physical Exam  Constitutional: She appears well-nourished. No distress.  overwt and well app  HENT:  Head: Normocephalic and atraumatic.  Mouth/Throat: Oropharynx is clear and moist.  Eyes: Conjunctivae and EOM are normal. Pupils are equal, round, and reactive to light. No scleral icterus.  Neck: Normal range of motion. Neck supple. No JVD present. No thyromegaly present.  Cardiovascular: Normal rate, regular rhythm, normal heart sounds and intact distal pulses.  Exam reveals no gallop.   No murmur heard. Pulmonary/Chest: Effort normal and breath sounds normal. No respiratory  distress. She has no wheezes. She has no rales. She exhibits tenderness.  Tender in L anterolateral ribs without crepitus or skin change   Abdominal: Soft. Bowel sounds are normal.  Musculoskeletal: She exhibits no edema.  No palp cords in legs   Lymphadenopathy:    She has no cervical adenopathy.  Neurological: She is alert. She has normal reflexes.  Skin: Skin is warm and dry. No rash noted. No erythema.  Psychiatric: She has a normal mood and affect.          Assessment & Plan:

## 2012-12-09 NOTE — Patient Instructions (Signed)
I think you have some chest wall strain / costochondritis  Use heat on and off on affected area  Try to stretch as tolerated  Use mobic once daily with food  Flexeril - at bedtime if you need it  Update if not starting to improve in a week or if worsening

## 2012-12-11 NOTE — Assessment & Plan Note (Signed)
Suspect costochondritis Handout given Recommend warm compresses/ stretching / taking deep breaths to prevent atelectasis meloxicam with food prn Update if not starting to improve in a week or if worsening

## 2013-02-28 ENCOUNTER — Telehealth: Payer: Self-pay | Admitting: Family Medicine

## 2013-02-28 ENCOUNTER — Ambulatory Visit: Payer: BC Managed Care – PPO | Admitting: Internal Medicine

## 2013-02-28 DIAGNOSIS — Z0289 Encounter for other administrative examinations: Secondary | ICD-10-CM

## 2013-02-28 NOTE — Telephone Encounter (Signed)
Left detailed message on voicemail.  

## 2013-02-28 NOTE — Telephone Encounter (Signed)
Pt filled out triage form saying she had an appt today but had the time wrong and had to reschedule for tomorrow.  She is nauseated and dizzy.  She is requesting RX for nausea until she can be seen tomorrow.

## 2013-02-28 NOTE — Telephone Encounter (Signed)
inst her to try dramamine (meclizine) otc as directed - it should help both dizziness and nausea - but watch out for sedation If symptoms suddenly worsen tonight seek care in ER or UC

## 2013-03-01 ENCOUNTER — Encounter: Payer: Self-pay | Admitting: Internal Medicine

## 2013-03-01 ENCOUNTER — Ambulatory Visit (INDEPENDENT_AMBULATORY_CARE_PROVIDER_SITE_OTHER): Payer: BC Managed Care – PPO | Admitting: Internal Medicine

## 2013-03-01 VITALS — BP 158/104 | HR 90 | Temp 98.6°F | Wt 164.0 lb

## 2013-03-01 DIAGNOSIS — R42 Dizziness and giddiness: Secondary | ICD-10-CM

## 2013-03-01 DIAGNOSIS — J329 Chronic sinusitis, unspecified: Secondary | ICD-10-CM

## 2013-03-01 MED ORDER — FLUTICASONE PROPIONATE 50 MCG/ACT NA SUSP: 2.0000 | Freq: Every day | NASAL | Status: DC

## 2013-03-01 MED ORDER — MECLIZINE HCL 50 MG PO TABS: 50.0000 mg | ORAL_TABLET | Freq: Three times a day (TID) | ORAL | Status: DC | PRN

## 2013-03-01 NOTE — Progress Notes (Signed)
 Subjective:    Patient ID: Ellen [name], female    DOB: 11/26/1970, 43 y.o.   MRN: [id number]  HPI  Pt presents to the clinic today with c/o dizziness and headaches. This started yesterday. She reports that she feels off balances. This mostly occurs with changing positions. The headache is on the left side of her head. She denies blurred vision, nausea, or vomiting. She denies chest pain, chest tightness or shortness of breath. She reports that she did get off a cruise about 3 day ago but did not have any problems on the cruise. Her LMP was 02/20/2012. She has tried some migraine medication but it didn't seem to help.  Review of Systems      Past Medical History  Diagnosis Date  . Allergic rhinitis due to pollen   . Hyperlipidemia   . DM (diabetes mellitus), gestational   . 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 Migraine   . Seborrheic dermatitis     Current Outpatient Prescriptions  Medication Sig Dispense Refill  . Prenatal Vit-Fe Fumarate-FA (PRENATAL MULTIVITAMIN) TABS tablet Take 1 tablet by mouth daily at 12 noon.       No current facility-administered medications for this visit.    No Known Allergies  Family History  Problem Relation Age of Onset  . Liver cancer Mother   . Diabetes Father   . Heart disease Father   . Migraines Sister   . Migraines Sister   . Migraines Sister   . Colon cancer Neg Hx     History   Social History  . Marital Status: Married    Spouse Name: N/A    Number of Children: 2  . Years of Education: N/A   Occupational History  . Account manager    Social History Main Topics  . Smoking status: Never Smoker   . Smokeless tobacco: Never Used  . Alcohol Use: No  . Drug Use: No  . Sexual Activity: Not on file   Other Topics Concern  . Not on file   Social History Narrative  . No narrative on file     Constitutional: Pt reports headache. Denies fever, malaise, fatigue, or abrupt weight changes.  HEENT: Denies  blurred vision, eye pain, eye redness, ear pain, ringing in the ears, wax buildup, runny nose, nasal congestion, bloody nose, or sore throat. Respiratory: Denies difficulty breathing, shortness of breath, cough or sputum production.   Cardiovascular: Denies chest pain, chest tightness, palpitations or swelling in the hands or feet.  Gastrointestinal: Denies abdominal pain, bloating, constipation, diarrhea or blood in the stool.  Neurological: Pt reports dizziness. Denies difficulty with memory, difficulty with speech or problems with balance and coordination.   No other specific complaints in a complete review of systems (except as listed in HPI above).  Objective:   Physical Exam  BP 158/104  Pulse 90  Temp(Src) 98.6 F (37 C) (Oral)  Wt 164 lb (74.39 kg)  SpO2 99% Wt Readings from Last 3 Encounters:  03/01/13 164 lb (74.39 kg)  12/09/12 160 lb (72.576 kg)  05/04/12 154 lb 12 oz (70.194 kg)    General: Appears her stated age, well developed, well nourished in NAD. HEENT: Head: normal shape and size; Eyes: sclera white, no icterus, conjunctiva pink, PERRLA and EOMs intact; Ears: Tm's gray and intact, normal light reflex; Nose: mucosa boggy and moist, septum midline; Throat/Mouth: Teeth present, mucosa pink and moist, no exudate, lesions or ulcerations noted.  Cardiovascular: Normal   rate and rhythm. S1,S2 noted.  No murmur, rubs or gallops noted. No JVD or BLE edema. No carotid bruits noted. Pulmonary/Chest: Normal effort and positive vesicular breath sounds. No respiratory distress. No wheezes, rales or ronchi noted.  Abdomen: Soft and nontender. Normal bowel sounds, no bruits noted. No distention or masses noted. Liver, spleen and kidneys non palpable. Neurological: Alert and oriented. Cranial nerves II-XII intact. Coordination normal. +DTRs bilaterally.   BMET    Component Value Date/Time   NA 138 10/20/2011 1006   K 3.5 10/20/2011 1006   CL 98 10/20/2011 1006   CO2 26 10/20/2011  1006   GLUCOSE 133* 10/20/2011 1006   BUN 13 10/20/2011 1006   CREATININE 0.8 10/20/2011 1006   CALCIUM 8.4 10/20/2011 1006   CALCIUM 8.8 03/25/2009 2208   GFRNONAA 102.20 01/23/2009 1311   GFRAA  Value: >60        The eGFR has been calculated using the MDRD equation. This calculation has not been validated in all clinical situations. eGFR's persistently <60 mL/min signify possible Chronic Kidney Disease. 02/12/2008 2330    Lipid Panel     Component Value Date/Time   CHOL 207* 03/18/2010 1024   TRIG 49.0 03/18/2010 1024   HDL 66.50 03/18/2010 1024   CHOLHDL 3 03/18/2010 1024   VLDL 9.8 03/18/2010 1024   LDLCALC 99 01/23/2009 1311    CBC    Component Value Date/Time   WBC 5.4 10/20/2011 1006   RBC 5.01 10/20/2011 1006   HGB 12.6 10/20/2011 1006   HCT 40.2 10/20/2011 1006   PLT 240.0 10/20/2011 1006   MCV 80.0 10/20/2011 1006   MCHC 31.3 10/20/2011 1006   RDW 15.9* 10/20/2011 1006   LYMPHSABS 1.9 10/20/2011 1006   MONOABS 0.4 10/20/2011 1006   EOSABS 0.3 10/20/2011 1006   BASOSABS 0.0 10/20/2011 1006    Hgb A1C Lab Results  Component Value Date   HGBA1C 5.8 02/15/2008         Assessment & Plan:   Dizziness secondary to sinusitis:  Discussed different types of dizziness I thinks she is having some sinus related issues eRx for flonase and meclizine- instructed her to breath the meclizine in half Drink plenty of fluids to avoid dehydration as this may worsen dizziness Make positions changes slowly  Of note, her BP is elevated. Looking back this is a single incident. She reports she took a sinus medication which usually increases her blood pressure  RTC as needed or if symptoms persist or worsen. 

## 2013-03-01 NOTE — Patient Instructions (Signed)

## 2013-03-01 NOTE — Progress Notes (Signed)
Pre-visit discussion using our clinic review tool. No additional management support is needed unless otherwise documented below in the visit note.  

## 2013-03-21 ENCOUNTER — Other Ambulatory Visit: Payer: BC Managed Care – PPO

## 2013-03-23 ENCOUNTER — Ambulatory Visit: Payer: BC Managed Care – PPO

## 2013-09-28 ENCOUNTER — Telehealth: Payer: Self-pay | Admitting: Family Medicine

## 2013-09-28 NOTE — Telephone Encounter (Signed)
Pt dropped of health examination certificate to be filled out Put on shapale's desk

## 2013-09-28 NOTE — Telephone Encounter (Signed)
Form placed in your inbox

## 2013-10-01 NOTE — Telephone Encounter (Signed)
I filled it out, however she does need a Tdap according to chart-so please schedule nurse visit for that and she can pick up form

## 2013-10-04 NOTE — Telephone Encounter (Signed)
Pt left vm on triage phone wanting to know the status of the form she dropped off last week.  She said she was told it would be 48 hours and has now been 5 days.  She is requesting cb at [phone].

## 2013-10-04 NOTE — Telephone Encounter (Signed)
Pt called back checking on status of form  I made her a nurse visit for 9/3 @ 2:30 for her tdap shot

## 2013-10-05 ENCOUNTER — Encounter: Payer: Self-pay | Admitting: *Deleted

## 2013-10-05 ENCOUNTER — Ambulatory Visit (INDEPENDENT_AMBULATORY_CARE_PROVIDER_SITE_OTHER): Payer: BC Managed Care – PPO | Admitting: *Deleted

## 2013-10-05 DIAGNOSIS — Z111 Encounter for screening for respiratory tuberculosis: Secondary | ICD-10-CM

## 2013-10-05 DIAGNOSIS — Z23 Encounter for immunization: Secondary | ICD-10-CM

## 2013-10-07 LAB — TB SKIN TEST
Induration: 1 mm
TB Skin Test: NEGATIVE

## 2013-10-12 ENCOUNTER — Ambulatory Visit (INDEPENDENT_AMBULATORY_CARE_PROVIDER_SITE_OTHER): Payer: BC Managed Care – PPO | Admitting: Internal Medicine

## 2013-10-12 ENCOUNTER — Encounter: Payer: Self-pay | Admitting: Internal Medicine

## 2013-10-12 VITALS — BP 134/82 | HR 98 | Temp 98.8°F | Wt 164.0 lb

## 2013-10-12 DIAGNOSIS — N764 Abscess of vulva: Secondary | ICD-10-CM

## 2013-10-12 MED ORDER — SULFAMETHOXAZOLE-TMP DS 800-160 MG PO TABS: 1.0000 | ORAL_TABLET | Freq: Two times a day (BID) | ORAL | Status: DC

## 2013-10-12 NOTE — Patient Instructions (Signed)

## 2013-10-12 NOTE — Progress Notes (Signed)
Subjective:    Patient ID: Ellen [name], female    DOB: [birth year], 43 y.o.   MRN: [id number]  HPI  Pt presents to the clinic today with groin pain. She reports this started yesterday. She has also noticed some lumps near her vagina. They are sore to touch. It seems to be more painful with walking. She denies urinary symptoms or vaginal discharge. She has never had anything like this in the past. She is sexually active. Her LMP was 09/17/13.  Review of Systems      Past Medical History  Diagnosis Date  . Allergic rhinitis due to pollen   . Hyperlipidemia   . DM (diabetes mellitus), gestational   . 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 Migraine   . Seborrheic dermatitis     Current Outpatient Prescriptions  Medication Sig Dispense Refill  . fluticasone (FLONASE) 50 MCG/ACT nasal spray Place 2 sprays into both nostrils daily.  16 g  6  . meloxicam (MOBIC) 15 MG tablet Take 15 mg by mouth daily.      . Prenatal Vit-Fe Fumarate-FA (PRENATAL MULTIVITAMIN) TABS tablet Take 1 tablet by mouth daily at 12 noon.       No current facility-administered medications for this visit.    No Known Allergies  Family History  Problem Relation Age of Onset  . Liver cancer Mother   . Diabetes Father   . Heart disease Father   . Migraines Sister   . Migraines Sister   . Migraines Sister   . Colon cancer Neg Hx     History   Social History  . Marital Status: Married    Spouse Name: N/A    Number of Children: 2  . Years of Education: N/A   Occupational History  . Account manager    Social History Main Topics  . Smoking status: Never Smoker   . Smokeless tobacco: Never Used  . Alcohol Use: No  . Drug Use: No  . Sexual Activity: Not on file   Other Topics Concern  . Not on file   Social History Narrative  . No narrative on file     Constitutional: Denies fever, malaise, fatigue, headache or abrupt weight changes.  GU: Denies urgency, frequency, pain with  urination, burning sensation, blood in urine, odor or discharge. Skin: Pt reports lumps near vaginal area. Denies redness, rashes.    No other specific complaints in a complete review of systems (except as listed in HPI above).  Objective:   Physical Exam  BP 134/82  Pulse 98  Temp(Src) 98.8 F (37.1 C) (Oral)  Wt 164 lb (74.39 kg)  SpO2 98% Wt Readings from Last 3 Encounters:  10/12/13 164 lb (74.39 kg)  03/01/13 164 lb (74.39 kg)  12/09/12 160 lb (72.576 kg)    General: Appears her stated age, well developed, well nourished in NAD. Skin: Warm, dry and intact. No rashes, lesions or ulcerations noted. 2 small pea sized nodules noted on bilateral labia majora. No erythema or cellulitis noted Cardiovascular: Normal rate and rhythm. S1,S2 noted.  No murmur, rubs or gallops noted.  Pulmonary/Chest: Normal effort and positive vesicular breath sounds. No respiratory distress. No wheezes, rales or ronchi noted.  Abdomen: Soft and nontender. Normal bowel sounds, no bruits noted. No distention or masses noted. Liver, spleen and kidneys non palpable.   BMET    Component Value Date/Time   NA 138 10/20/2011 1006   K 3.5 10/20/2011 1006  CL 98 10/20/2011 1006   CO2 26 10/20/2011 1006   GLUCOSE 133* 10/20/2011 1006   BUN 13 10/20/2011 1006   CREATININE 0.8 10/20/2011 1006   CALCIUM 8.4 10/20/2011 1006   CALCIUM 8.8 03/25/2009 2208   GFRNONAA 102.20 01/23/2009 1311   GFRAA  Value: >60        The eGFR has been calculated using the MDRD equation. This calculation has not been validated in all clinical situations. eGFR's persistently <60 mL/min signify possible Chronic Kidney Disease. 02/12/2008 2330    Lipid Panel     Component Value Date/Time   CHOL 207* 03/18/2010 1024   TRIG 49.0 03/18/2010 1024   HDL 66.50 03/18/2010 1024   CHOLHDL 3 03/18/2010 1024   VLDL 9.8 03/18/2010 1024   LDLCALC 99 01/23/2009 1311    CBC    Component Value Date/Time   WBC 5.4 10/20/2011 1006   RBC 5.01 10/20/2011  1006   HGB 12.6 10/20/2011 1006   HCT 40.2 10/20/2011 1006   PLT 240.0 10/20/2011 1006   MCV 80.0 10/20/2011 1006   MCHC 31.3 10/20/2011 1006   RDW 15.9* 10/20/2011 1006   LYMPHSABS 1.9 10/20/2011 1006   MONOABS 0.4 10/20/2011 1006   EOSABS 0.3 10/20/2011 1006   BASOSABS 0.0 10/20/2011 1006    Hgb A1C Lab Results  Component Value Date   HGBA1C 5.8 02/15/2008         Assessment & Plan:   Labial abscess:  Advised her to soak in a warm tub to encourage the area to drain eRx for septra BID x 7 days Avoid friction in that area if you can  RTC as needed or if symptoms persist or worsen

## 2013-10-12 NOTE — Progress Notes (Signed)
Pre visit review using our clinic review tool, if applicable. No additional management support is needed unless otherwise documented below in the visit note. 

## 2013-11-20 ENCOUNTER — Other Ambulatory Visit: Payer: Self-pay

## 2013-11-20 DIAGNOSIS — Z1239 Encounter for other screening for malignant neoplasm of breast: Secondary | ICD-10-CM

## 2013-12-11 ENCOUNTER — Ambulatory Visit
Admission: RE | Admit: 2013-12-11 | Discharge: 2013-12-11 | Disposition: A | Discharge status: Home / Self Care | Payer: BC Managed Care – PPO | Source: Ambulatory Visit

## 2013-12-11 DIAGNOSIS — Z1239 Encounter for other screening for malignant neoplasm of breast: Secondary | ICD-10-CM

## 2013-12-15 ENCOUNTER — Encounter: Payer: Self-pay | Admitting: *Deleted

## 2014-01-11 ENCOUNTER — Telehealth: Payer: Self-pay

## 2014-01-11 ENCOUNTER — Ambulatory Visit (INDEPENDENT_AMBULATORY_CARE_PROVIDER_SITE_OTHER): Payer: BC Managed Care – PPO | Admitting: Internal Medicine

## 2014-01-11 ENCOUNTER — Encounter: Payer: Self-pay | Admitting: Internal Medicine

## 2014-01-11 VITALS — BP 122/70 | HR 104 | Temp 98.6°F | Wt 157.0 lb

## 2014-01-11 DIAGNOSIS — R42 Dizziness and giddiness: Secondary | ICD-10-CM

## 2014-01-11 DIAGNOSIS — N946 Dysmenorrhea, unspecified: Secondary | ICD-10-CM

## 2014-01-11 DIAGNOSIS — R110 Nausea: Secondary | ICD-10-CM

## 2014-01-11 MED ORDER — NORETHIN-ETH ESTRAD-FE BIPHAS 1 MG-10 MCG / 10 MCG PO TABS: 1.0000 | ORAL_TABLET | Freq: Every day | ORAL | Status: DC

## 2014-01-11 NOTE — Progress Notes (Signed)
Pre visit review using our clinic review tool, if applicable. No additional management support is needed unless otherwise documented below in the visit note. 

## 2014-01-11 NOTE — Telephone Encounter (Signed)
PLEASE NOTE: All timestamps contained within this report are represented as [place] Standard Time. CONFIDENTIALTY NOTICE: This fax transmission is intended only for the addressee. It contains information that is legally privileged, confidential or otherwise protected from use or disclosure. If you are not the intended recipient, you are strictly prohibited from reviewing, disclosing, copying using or disseminating any of this information or taking any action in reliance on or regarding this information. If you have received this fax in error, please notify us immediately by telephone so that we can arrange for its return to [place]. Phone: [phone], Toll-Free: [phone], Fax: [phone] Page: 1 of 2 Call Id: [id number] [place] Patient Name: Ellen [name] Gender: Female DOB: 05-07-1970 Age: 43 Y 10 M 25 D Return Phone Number: [phone] (Primary), [phone] (Secondary) Address: City/State/Zip:  Client [facility] [place] - Client Client Site [facility], [place] Contact Type Call Call Type Triage / Clinical Caller Name Annie Main Relationship To Patient Spouse Return Phone Number [phone] (Secondary) Chief Complaint Dizziness Initial Comment Caller states his wife is nauseas and has abd cramping. She gets dizzy when she sits up. She vomits when she eats. PreDisposition Did not know what to do Nurse Assessment Nurse: Neil Crouch, RN, Baker Janus Date/Time Eilene Ghazi Time): 01/11/2014 6:47:45 AM Confirm and document reason for call. If symptomatic, describe symptoms. ---Caller states his wife is nauseas and has abd cramping. She gets dizzy when she sits up. She vomits when she eats. started this a.m. no fever or diarrhea. , has had this before with periods. Has the patient traveled out of the country within the last  30 days? ---Not Applicable Does the patient require triage? ---Yes Related visit to physician within the last 2 weeks? ---No Does the PT have any chronic conditions? (i.e. diabetes, asthma, etc.) ---No Did the patient indicate they were pregnant? ---No Guidelines Guideline Title Affirmed Question Affirmed Notes Nurse Date/Time (Eastern Time) Dizziness - Lightheadedness SEVERE dizziness (e.g., unable to stand, requires support to walk, feels like passing out now) Lenis Noon 01/11/2014 6:50:21 AM Disp. Time Eilene Ghazi Time) Disposition Final User 01/11/2014 6:53:58 AM Call Completed Lenis Noon 01/11/2014 6:52:36 AM Go to ED Now (or PCP triage) Yes Neil Crouch, RN, Christin Bach Understands: Yes PLEASE NOTE: All timestamps contained within this report are represented as [place] Standard Time. CONFIDENTIALTY NOTICE: This fax transmission is intended only for the addressee. It contains information that is legally privileged, confidential or otherwise protected from use or disclosure. If you are not the intended recipient, you are strictly prohibited from reviewing, disclosing, copying using or disseminating any of this information or taking any action in reliance on or regarding this information. If you have received this fax in error, please notify us immediately by telephone so that we can arrange for its return to [place]. Phone: [phone], Toll-Free: [phone], Fax: [phone] Page: 2 of 2 Call Id: [id number] Disagree/Comply: Comply Care Advice Given Per Guideline GO TO ED NOW (OR PCP TRIAGE): * IF NO PCP TRIAGE: You need to be seen. Go to the [facility] at [facility] within the next hour. Leave as soon as you can. DRIVING: Another adult should drive. BRING MEDICINES: CARE ADVICE given per Dizziness (Adult) guideline. After Care Instructions Given Call Event Type User Date / Time Description Comments User: George Ina, RN Date/Time Eilene Ghazi Time):  01/11/2014 6:53:48 AM now wanting to just be seen in the  office. Referrals GO TO FACILITY UNDECIDED

## 2014-01-11 NOTE — Progress Notes (Signed)
Subjective:    Patient ID: Ellen [name], female    DOB: 1970/09/15, 43 y.o.   MRN: [id number]  HPI  Pt presents to the clinic today with c/o abdominal pains and nausea. She reports this started this morning. She describes the abdominal pain as cramping. She did vomit x 1 today. She feels dizzy when she sits up. She is having some associated loss stools She denies fever, chills, or blood in stool. She did start her menstrual cycle last night. She reports she has had similar symptoms with her menstrual cycle in the past. The reason she comes in today is because prior to 6 months ago she had not had symptoms like this with her menses. She continues to have regular monthly periods.  Review of Systems      Past Medical History  Diagnosis Date  . Allergic rhinitis due to pollen   . Hyperlipidemia   . DM (diabetes mellitus), gestational   . 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 Migraine   . Seborrheic dermatitis     Current Outpatient Prescriptions  Medication Sig Dispense Refill  . fluticasone (FLONASE) 50 MCG/ACT nasal spray Place 2 sprays into both nostrils daily. 16 g 6  . meloxicam (MOBIC) 15 MG tablet Take 15 mg by mouth daily.    . Prenatal Vit-Fe Fumarate-FA (PRENATAL MULTIVITAMIN) TABS tablet Take 1 tablet by mouth daily at 12 noon.     No current facility-administered medications for this visit.    No Known Allergies  Family History  Problem Relation Age of Onset  . Liver cancer Mother   . Diabetes Father   . Heart disease Father   . Migraines Sister   . Migraines Sister   . Migraines Sister   . Colon cancer Neg Hx     History   Social History  . Marital Status: Married    Spouse Name: N/A    Number of Children: 2  . Years of Education: N/A   Occupational History  . Account manager    Social History Main Topics  . Smoking status: Never Smoker   . Smokeless tobacco: Never Used  . Alcohol Use: No  . Drug Use: No  . Sexual Activity: Not  on file   Other Topics Concern  . Not on file   Social History Narrative     Constitutional: Denies fever, malaise, fatigue, headache or abrupt weight changes.  Respiratory: Denies difficulty breathing, shortness of breath, cough or sputum production.   Cardiovascular: Denies chest pain, chest tightness, palpitations or swelling in the hands or feet.  Gastrointestinal: Pt reports abdominal pain, nausea and vomiting. Denies abdominal pain, bloating, constipation or blood in the stool.  GU: Denies urgency, frequency, pain with urination, burning sensation, blood in urine, odor or discharge. Neurological: Pt reports dizziness. Denies difficulty with memory, difficulty with speech or problems with balance and coordination.   No other specific complaints in a complete review of systems (except as listed in HPI above).  Objective:   Physical Exam   BP 122/70 mmHg  Pulse 104  Temp(Src) 98.6 F (37 C) (Oral)  Wt 157 lb (71.215 kg)  SpO2 98%  LMP 01/10/2014 Wt Readings from Last 3 Encounters:  01/11/14 157 lb (71.215 kg)  10/12/13 164 lb (74.39 kg)  03/01/13 164 lb (74.39 kg)    General: Appears her stated age, well developed, well nourished in NAD. Cardiovascular: Normal rate and rhythm. S1,S2 noted.  No murmur, rubs or gallops  noted. Pulmonary/Chest: Normal effort and positive vesicular breath sounds. No respiratory distress. No wheezes, rales or ronchi noted.  Abdomen: Distended but soft. Tender in the suprapubic area. Normal bowel sounds, no bruits noted. No masses noted. Liver, spleen and kidneys non palpable. Neurological: Alert and oriented. Negative [place]  BMET    Component Value Date/Time   NA 138 10/20/2011 1006   K 3.5 10/20/2011 1006   CL 98 10/20/2011 1006   CO2 26 10/20/2011 1006   GLUCOSE 133* 10/20/2011 1006   BUN 13 10/20/2011 1006   CREATININE 0.8 10/20/2011 1006   CALCIUM 8.4 10/20/2011 1006   CALCIUM 8.8 03/25/2009 2208   GFRNONAA 102.20 01/23/2009  1311   GFRAA  02/12/2008 2330    >60        The eGFR has been calculated using the MDRD equation. This calculation has not been validated in all clinical situations. eGFR's persistently <60 mL/min signify possible Chronic Kidney Disease.    Lipid Panel     Component Value Date/Time   CHOL 207* 03/18/2010 1024   TRIG 49.0 03/18/2010 1024   HDL 66.50 03/18/2010 1024   CHOLHDL 3 03/18/2010 1024   VLDL 9.8 03/18/2010 1024   LDLCALC 99 01/23/2009 1311    CBC    Component Value Date/Time   WBC 5.4 10/20/2011 1006   RBC 5.01 10/20/2011 1006   HGB 12.6 10/20/2011 1006   HCT 40.2 10/20/2011 1006   PLT 240.0 10/20/2011 1006   MCV 80.0 10/20/2011 1006   MCHC 31.3 10/20/2011 1006   RDW 15.9* 10/20/2011 1006   LYMPHSABS 1.9 10/20/2011 1006   MONOABS 0.4 10/20/2011 1006   EOSABS 0.3 10/20/2011 1006   BASOSABS 0.0 10/20/2011 1006    Hgb A1C Lab Results  Component Value Date   HGBA1C 5.8 02/15/2008        Assessment & Plan:  Menstrual pain, dizziness, nausea, loose stool:  This are all symptoms related to her menses but she reports that the are worse than they have ever been She does have fibroids- [place] from 2014 reviewed Will check CBC, CMET and iron panel today May need to consider repeating ultrasound She is interested in OCP to help control symptoms No history of clotting disorder, breast cancer, she does not smoke eRx for lo loestrin  Will follow up with you after the labs are back, RTC as needed

## 2014-01-12 LAB — COMPREHENSIVE METABOLIC PANEL
ALT: 12 U/L (ref 0–35)
AST: 20 U/L (ref 0–37)
Albumin: 4.2 g/dL (ref 3.5–5.2)
Alkaline Phosphatase: 68 U/L (ref 39–117)
BUN: 16 mg/dL (ref 6–23)
CO2: 23 meq/L (ref 19–32)
CREATININE: 1 mg/dL (ref 0.4–1.2)
Calcium: 9.3 mg/dL (ref 8.4–10.5)
Chloride: 104 mEq/L (ref 96–112)
GFR: 78.04 mL/min (ref >60.00)
Glucose, Bld: 115 mg/dL — ABNORMAL HIGH (ref 70–99)
Potassium: 4 mEq/L (ref 3.5–5.1)
Sodium: 134 mEq/L — ABNORMAL LOW (ref 135–145)
Total Bilirubin: 0.7 mg/dL (ref 0.2–1.2)
Total Protein: 8 g/dL (ref 6.0–8.3)

## 2014-01-12 LAB — IRON: IRON: 21 ug/dL — AB (ref 42–145)

## 2014-01-12 LAB — CBC
HEMATOCRIT: 40.9 % (ref 36.0–46.0)
HEMOGLOBIN: 12.9 g/dL (ref 12.0–15.0)
MCHC: 31.6 g/dL (ref 30.0–36.0)
MCV: 78.5 fl (ref 78.0–100.0)
Platelets: 252 10*3/uL (ref 150.0–400.0)
RBC: 5.21 Mil/uL — ABNORMAL HIGH (ref 3.87–5.11)
RDW: 16.6 % — ABNORMAL HIGH (ref 11.5–15.5)
WBC: 11.1 10*3/uL — AB (ref 4.0–10.5)

## 2014-01-12 LAB — FERRITIN: FERRITIN: 43.6 ng/mL (ref 10.0–291.0)

## 2014-03-20 ENCOUNTER — Telehealth: Payer: Self-pay | Admitting: Family Medicine

## 2014-03-20 DIAGNOSIS — R739 Hyperglycemia, unspecified: Secondary | ICD-10-CM

## 2014-03-20 DIAGNOSIS — Z0000 Encounter for general adult medical examination without abnormal findings: Secondary | ICD-10-CM | POA: Insufficient documentation

## 2014-03-20 NOTE — Telephone Encounter (Signed)
-----   Message from Ellamae Sia sent at 03/15/2014  3:34 PM EST ----- Regarding: Lab orders for Wednesday, 2.17.16 Patient is scheduled for CPX labs, please order future labs, Thanks , Karna Christmas

## 2014-03-21 ENCOUNTER — Other Ambulatory Visit (INDEPENDENT_AMBULATORY_CARE_PROVIDER_SITE_OTHER): Payer: BLUE CROSS/BLUE SHIELD

## 2014-03-21 DIAGNOSIS — Z0000 Encounter for general adult medical examination without abnormal findings: Secondary | ICD-10-CM

## 2014-03-21 DIAGNOSIS — R739 Hyperglycemia, unspecified: Secondary | ICD-10-CM

## 2014-03-21 LAB — LIPID PANEL
CHOL/HDL RATIO: 4
Cholesterol: 203 mg/dL — ABNORMAL HIGH (ref 0–200)
HDL: 57 mg/dL (ref >39.00)
LDL CALC: 131 mg/dL — AB (ref 0–99)
NonHDL: 146
Triglycerides: 73 mg/dL (ref 0.0–149.0)
VLDL: 14.6 mg/dL (ref 0.0–40.0)

## 2014-03-21 LAB — COMPREHENSIVE METABOLIC PANEL
ALBUMIN: 4.1 g/dL (ref 3.5–5.2)
ALT: 9 U/L (ref 0–35)
AST: 13 U/L (ref 0–37)
Alkaline Phosphatase: 68 U/L (ref 39–117)
BUN: 15 mg/dL (ref 6–23)
CO2: 25 meq/L (ref 19–32)
CREATININE: 0.91 mg/dL (ref 0.40–1.20)
Calcium: 9.1 mg/dL (ref 8.4–10.5)
Chloride: 103 mEq/L (ref 96–112)
GFR: 85.94 mL/min (ref >60.00)
Glucose, Bld: 138 mg/dL — ABNORMAL HIGH (ref 70–99)
POTASSIUM: 4 meq/L (ref 3.5–5.1)
Sodium: 136 mEq/L (ref 135–145)
Total Bilirubin: 0.4 mg/dL (ref 0.2–1.2)
Total Protein: 7.7 g/dL (ref 6.0–8.3)

## 2014-03-21 LAB — HEMOGLOBIN A1C: Hgb A1c MFr Bld: 7 % — ABNORMAL HIGH (ref 4.6–6.5)

## 2014-03-21 LAB — CBC WITH DIFFERENTIAL/PLATELET
BASOS PCT: 0.7 % (ref 0.0–3.0)
Basophils Absolute: 0 10*3/uL (ref 0.0–0.1)
Eosinophils Absolute: 0.3 10*3/uL (ref 0.0–0.7)
Eosinophils Relative: 4.8 % (ref 0.0–5.0)
HCT: 38.3 % (ref 36.0–46.0)
HEMOGLOBIN: 12.5 g/dL (ref 12.0–15.0)
LYMPHS PCT: 34.4 % (ref 12.0–46.0)
Lymphs Abs: 1.9 10*3/uL (ref 0.7–4.0)
MCHC: 32.6 g/dL (ref 30.0–36.0)
MCV: 76.5 fl — AB (ref 78.0–100.0)
Monocytes Absolute: 0.5 10*3/uL (ref 0.1–1.0)
Monocytes Relative: 8.5 % (ref 3.0–12.0)
NEUTROS ABS: 2.9 10*3/uL (ref 1.4–7.7)
Neutrophils Relative %: 51.6 % (ref 43.0–77.0)
Platelets: 248 10*3/uL (ref 150.0–400.0)
RBC: 5.01 Mil/uL (ref 3.87–5.11)
RDW: 16.9 % — ABNORMAL HIGH (ref 11.5–15.5)
WBC: 5.7 10*3/uL (ref 4.0–10.5)

## 2014-03-21 LAB — TSH: TSH: 1.68 u[IU]/mL (ref 0.35–4.50)

## 2014-03-28 ENCOUNTER — Encounter: Payer: Self-pay | Admitting: Family Medicine

## 2014-03-28 ENCOUNTER — Encounter: Payer: BC Managed Care – PPO | Admitting: Family Medicine

## 2014-03-28 ENCOUNTER — Ambulatory Visit (INDEPENDENT_AMBULATORY_CARE_PROVIDER_SITE_OTHER): Payer: BLUE CROSS/BLUE SHIELD | Admitting: Family Medicine

## 2014-03-28 VITALS — BP 128/88 | HR 85 | Temp 98.3°F | Ht 63.0 in | Wt 161.4 lb

## 2014-03-28 DIAGNOSIS — Z23 Encounter for immunization: Secondary | ICD-10-CM

## 2014-03-28 DIAGNOSIS — E785 Hyperlipidemia, unspecified: Secondary | ICD-10-CM

## 2014-03-28 DIAGNOSIS — Z0000 Encounter for general adult medical examination without abnormal findings: Secondary | ICD-10-CM

## 2014-03-28 DIAGNOSIS — E119 Type 2 diabetes mellitus without complications: Secondary | ICD-10-CM

## 2014-03-28 NOTE — Progress Notes (Signed)
Subjective:    Patient ID: Ellen [name], female    DOB: [birth year], 44 y.o.   MRN: [id number]  HPI Here for health maintenance exam and to review chronic medical problems    She has some sinus pressure and pain on her L side of face (feels inflammed) - effects L eye and throat and ear req ENT ref  Not blowing out green or yellow mucous -all clear  Allegra for allergies    Wt is up 4 lb with bmi of 28 Trying to loose weight  When she works out - she tends to get cysts in groin (of note she was also shaving in that area)  She lost some wt with low carb diet    hiv screening   Pap 5/14 neg with Dr Helane Rima She missed her last appt  Has known fibroids but no surgery  loestrin was suggested -she did not try it  Her cycle is "out of control"- makes her lightheaded and nauseated and urgent/loose stool - she has urgency to go and then takes a long time on the toilet , she tried immodium AD and that caused constipation  Period is always heavy 3-4 days , and a long period of spotting at the end  Eating healthier and it is not as bad as it was (wanted to try this instead of OC)- she really does not want to start the pill (lot of side effects in the past)    Mammogram 11/15 -nl  Self exam -no lumps   Td 9/15   Flu shot   Hyperglycemia (with hx of gest dm) Lab Results  Component Value Date   HGBA1C 7.0* 03/21/2014  this is up  Had gestational DM  Also father has DM  Has done DM teaching with her husband in the past and also when she was pregnant  Her husband is DM also   Lipids Lab Results  Component Value Date   CHOL 203* 03/21/2014   CHOL 207* 03/18/2010   CHOL 166 01/23/2009   Lab Results  Component Value Date   HDL 57.00 03/21/2014   HDL 66.50 03/18/2010   HDL 51.00 01/23/2009   Lab Results  Component Value Date   LDLCALC 131* 03/21/2014   LDLCALC 99 01/23/2009   Lab Results  Component Value Date   TRIG 73.0 03/21/2014   TRIG 49.0 03/18/2010   TRIG  82.0 01/23/2009   Lab Results  Component Value Date   CHOLHDL 4 03/21/2014   CHOLHDL 3 03/18/2010   CHOLHDL 3 01/23/2009   Lab Results  Component Value Date   LDLDIRECT 125.4 03/18/2010   cholesterol is a little high  Goal LDL 100 or less       Results for orders placed or performed in visit on 03/21/14  CBC with Differential/Platelet  Result Value Ref Range   WBC 5.7 4.0 - 10.5 K/uL   RBC 5.01 3.87 - 5.11 Mil/uL   Hemoglobin 12.5 12.0 - 15.0 g/dL   HCT 38.3 36.0 - 46.0 %   MCV 76.5 (L) 78.0 - 100.0 fl   MCHC 32.6 30.0 - 36.0 g/dL   RDW 16.9 (H) 11.5 - 15.5 %   Platelets 248.0 150.0 - 400.0 K/uL   Neutrophils Relative % 51.6 43.0 - 77.0 %   Lymphocytes Relative 34.4 12.0 - 46.0 %   Monocytes Relative 8.5 3.0 - 12.0 %   Eosinophils Relative 4.8 0.0 - 5.0 %   Basophils Relative 0.7 0.0 - 3.0 %  Neutro Abs 2.9 1.4 - 7.7 K/uL   Lymphs Abs 1.9 0.7 - 4.0 K/uL   Monocytes Absolute 0.5 0.1 - 1.0 K/uL   Eosinophils Absolute 0.3 0.0 - 0.7 K/uL   Basophils Absolute 0.0 0.0 - 0.1 K/uL  Comprehensive metabolic panel  Result Value Ref Range   Sodium 136 135 - 145 mEq/L   Potassium 4.0 3.5 - 5.1 mEq/L   Chloride 103 96 - 112 mEq/L   CO2 25 19 - 32 mEq/L   Glucose, Bld 138 (H) 70 - 99 mg/dL   BUN 15 6 - 23 mg/dL   Creatinine, Ser 0.91 0.40 - 1.20 mg/dL   Total Bilirubin 0.4 0.2 - 1.2 mg/dL   Alkaline Phosphatase 68 39 - 117 U/L   AST 13 0 - 37 U/L   ALT 9 0 - 35 U/L   Total Protein 7.7 6.0 - 8.3 g/dL   Albumin 4.1 3.5 - 5.2 g/dL   Calcium 9.1 8.4 - 10.5 mg/dL   GFR 85.94 >60.00 mL/min  Hemoglobin A1c  Result Value Ref Range   Hgb A1c MFr Bld 7.0 (H) 4.6 - 6.5 %  Lipid panel  Result Value Ref Range   Cholesterol 203 (H) 0 - 200 mg/dL   Triglycerides 73.0 0.0 - 149.0 mg/dL   HDL 57.00 >39.00 mg/dL   VLDL 14.6 0.0 - 40.0 mg/dL   LDL Cholesterol 131 (H) 0 - 99 mg/dL   Total CHOL/HDL Ratio 4    NonHDL 146.00   TSH  Result Value Ref Range   TSH 1.68 0.35 - 4.50 uIU/mL      Review of Systems Review of Systems  Constitutional: Negative for fever, appetite change,  and unexpected weight change.  Eyes: Negative for pain and visual disturbance.  Respiratory: Negative for cough and shortness of breath.   Cardiovascular: Negative for cp or palpitations    Gastrointestinal: Negative for nausea, diarrhea and constipation.  Genitourinary: Negative for urgency and frequency. pos for problem menses  Skin: Negative for pallor or rash  pos for cysts in groin after shaving -now resolved  Neurological: Negative for weakness, light-headedness, numbness and headaches.  Hematological: Negative for adenopathy. Does not bruise/bleed easily.  Psychiatric/Behavioral: Negative for dysphoric mood. The patient is not nervous/anxious.         Objective:   Physical Exam  Constitutional: She appears well-developed and well-nourished. No distress.  overwt and well appearing   HENT:  Head: Normocephalic and atraumatic.  Right Ear: External ear normal.  Left Ear: External ear normal.  Nose: Nose normal.  Mouth/Throat: Oropharynx is clear and moist.  L maxillary sinus tenderness Boggy nares   Eyes: Conjunctivae and EOM are normal. Pupils are equal, round, and reactive to light. Right eye exhibits no discharge. Left eye exhibits no discharge. No scleral icterus.  Neck: Normal range of motion. Neck supple. No JVD present. Carotid bruit is not present. No thyromegaly present.  Cardiovascular: Normal rate, regular rhythm, normal heart sounds and intact distal pulses.  Exam reveals no gallop.   Pulmonary/Chest: Effort normal and breath sounds normal. No respiratory distress. She has no wheezes. She has no rales.  Abdominal: Soft. Bowel sounds are normal. She exhibits no distension and no mass. There is no tenderness.  Musculoskeletal: She exhibits no edema or tenderness.  Lymphadenopathy:    She has no cervical adenopathy.  Neurological: She is alert. She has normal reflexes. No  cranial nerve deficit. She exhibits normal muscle tone. Coordination normal.  Skin: Skin is warm  and dry. No rash noted. No erythema. No pallor.  No groin cysts today  Psychiatric: She has a normal mood and affect.          Assessment & Plan:   Problem List Items Addressed This Visit      Endocrine   Diabetes type 2, controlled    Lab Results  Component Value Date   HGBA1C 7.0* 03/21/2014   Had gestational DM in the past  Disc metformin-pt wants to work on diet and exercise first  Re check 3 mo          Other   Hyperlipidemia    Disc goals for lipids and reasons to control them Rev labs with pt Rev low sat fat diet in detail LDL not at goal for diabetes Will begin working on diet and consider statin if no improvement      Routine general medical examination at a health care facility    Reviewed health habits including diet and exercise and skin cancer prevention Reviewed appropriate screening tests for age  Also reviewed health mt list, fam hx and immunization status , as well as social and family history   See HPI Labs reviewed Will begin working on healthy diet and exercise  Flu vaccine today       Other Visit Diagnoses    Needs flu shot    -  Primary    Relevant Orders    Flu Vaccine QUAD 36+ mos IM (Completed)

## 2014-03-28 NOTE — Progress Notes (Signed)
Pre visit review using our clinic review tool, if applicable. No additional management support is needed unless otherwise documented below in the visit note. 

## 2014-03-28 NOTE — Patient Instructions (Addendum)
Get back to healthy diet and exercise for diabetes  Do not shave in areas where you get cysts  Also - call tomorrow and let me know exactly what brand of strips you need and where to send them  Remind me during that call to refer you to ENT  Follow up with DR Helane Rima about periods and hormone symptoms   For cholesterol (Avoid red meat/ fried foods/ egg yolks/ fatty breakfast meats/ butter, cheese and high fat dairy/ and shellfish )  Follow up in 3 months with labs prior   Flu shot today

## 2014-03-29 NOTE — Assessment & Plan Note (Signed)
Disc goals for lipids and reasons to control them Rev labs with pt Rev low sat fat diet in detail LDL not at goal for diabetes Will begin working on diet and consider statin if no improvement

## 2014-03-29 NOTE — Assessment & Plan Note (Signed)
Reviewed health habits including diet and exercise and skin cancer prevention Reviewed appropriate screening tests for age  Also reviewed health mt list, fam hx and immunization status , as well as social and family history   See HPI Labs reviewed Will begin working on healthy diet and exercise  Flu vaccine today

## 2014-03-29 NOTE — Assessment & Plan Note (Signed)
Lab Results  Component Value Date   HGBA1C 7.0* 03/21/2014   Had gestational DM in the past  Disc metformin-pt wants to work on diet and exercise first  Re check 3 mo

## 2014-04-06 ENCOUNTER — Telehealth: Payer: Self-pay | Admitting: Family Medicine

## 2014-04-06 DIAGNOSIS — J320 Chronic maxillary sinusitis: Secondary | ICD-10-CM

## 2014-04-06 NOTE — Telephone Encounter (Signed)
Patient is calling asking for a referral to an ENT.  For constant pain in left ear,left nostril is congested. Patient can take an appointment between 8am and 1pm.  Patient prefers [place]. Patient,also,needs a prescription for the glucose testing strips and lancets.  Patient uses [facility] on [address]

## 2014-04-08 NOTE — Addendum Note (Signed)
Addended by: Loura Pardon A on: 04/08/2014 08:47 AM   Modules accepted: Orders

## 2014-04-08 NOTE — Telephone Encounter (Signed)
I did the referral -let her know  What exact brand of strips and lancets ?

## 2014-04-09 MED ORDER — FREESTYLE SYSTEM KIT: 1.0000 | PACK | Status: DC | PRN

## 2014-04-09 MED ORDER — GLUCOSE BLOOD VI STRP: ORAL_STRIP | Status: DC

## 2014-04-09 MED ORDER — FREESTYLE LANCETS MISC: Status: DC

## 2014-04-09 NOTE — Telephone Encounter (Signed)
Patient aware of referral to ENT, test strips and glucometer sent to pharmacy.

## 2014-04-09 NOTE — Addendum Note (Signed)
Addended by: Amado Coe on: 04/09/2014 02:57 PM   Modules accepted: Orders

## 2014-04-20 ENCOUNTER — Other Ambulatory Visit: Payer: Self-pay | Admitting: Family Medicine

## 2014-04-23 ENCOUNTER — Telehealth: Payer: Self-pay

## 2014-04-23 NOTE — Telephone Encounter (Signed)
Office note from Dr Idolina Primer.  Per Dr Glori Bickers patient blood pressure was elevated.  Dr Glori Bickers would like patient to follow up in the late spring.  Called patient to inform her.  She will call back to schedule appointment.

## 2014-07-14 ENCOUNTER — Ambulatory Visit (INDEPENDENT_AMBULATORY_CARE_PROVIDER_SITE_OTHER): Payer: BLUE CROSS/BLUE SHIELD | Admitting: Family Medicine

## 2014-07-14 ENCOUNTER — Encounter: Payer: Self-pay | Admitting: Family Medicine

## 2014-07-14 VITALS — BP 160/100 | HR 77 | Temp 98.5°F | Resp 20 | Ht 63.0 in | Wt 152.2 lb

## 2014-07-14 DIAGNOSIS — J029 Acute pharyngitis, unspecified: Secondary | ICD-10-CM | POA: Diagnosis not present

## 2014-07-14 LAB — POCT RAPID STREP A (OFFICE): Rapid Strep A Screen: NEGATIVE

## 2014-07-14 NOTE — Progress Notes (Signed)
   Subjective:    Patient ID: Ellen [name], female    DOB: 1970/04/14, 44 y.o.   MRN: [id number]  HPI Acute visit for sore throat. Onset about 4 days ago. She's also had some nasal congestion, postnasal drip, and cough. Possible low-grade fever initially. Increased fatigue. She's been taking some Allegra-D and over-the-counter nasal spray. She was concerned about strep pharyngitis. Denies any nausea, vomiting, or diarrhea. Nonsmoker  Past Medical History  Diagnosis Date  . Allergic rhinitis due to pollen   . Hyperlipidemia   . DM (diabetes mellitus), gestational   . 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 Migraine   . Seborrheic dermatitis    Past Surgical History  Procedure Laterality Date  . Pilonidal cyst excision    . Uterine fibroid surgery    . Hystosalpingogram    . Cesarean section      x 2     reports that she has never smoked. She has never used smokeless tobacco. She reports that she does not drink alcohol or use illicit drugs. family history includes Diabetes in her father; Heart disease in her father; Liver cancer in her mother; Migraines in her sister, sister, and sister. There is no history of Colon cancer. No Known Allergies    Review of Systems  Constitutional: Positive for chills and fatigue.  HENT: Positive for congestion, postnasal drip and sore throat.   Respiratory: Positive for cough.        Objective:   Physical Exam  Constitutional: She appears well-developed and well-nourished.  HENT:  Right Ear: External ear normal.  Left Ear: External ear normal.  Mild posterior pharynx erythema. No exudate  Neck: Neck supple.  Cardiovascular: Normal rate and regular rhythm.   Pulmonary/Chest: Effort normal and breath sounds normal. No respiratory distress. She has no wheezes. She has no rales.  Lymphadenopathy:    She has no cervical adenopathy.          Assessment & Plan:  Pharyngitis. Rapid strep negative. Suspect viral. Treat  symptomatically. Discontinue decongestive which may be raising her blood pressure. Follow-up with primary within one month to reassess blood pressure

## 2014-07-14 NOTE — Patient Instructions (Signed)
Pharyngitis Pharyngitis is redness, pain, and swelling (inflammation) of your pharynx.  CAUSES  Pharyngitis is usually caused by infection. Most of the time, these infections are from viruses (viral) and are part of a cold. However, sometimes pharyngitis is caused by bacteria (bacterial). Pharyngitis can also be caused by allergies. Viral pharyngitis may be spread from person to person by coughing, sneezing, and personal items or utensils (cups, forks, spoons, toothbrushes). Bacterial pharyngitis may be spread from person to person by more intimate contact, such as kissing.  SIGNS AND SYMPTOMS  Symptoms of pharyngitis include:   Sore throat.   Tiredness (fatigue).   Low-grade fever.   Headache.  Joint pain and muscle aches.  Skin rashes.  Swollen lymph nodes.  Plaque-like film on throat or tonsils (often seen with bacterial pharyngitis). DIAGNOSIS  Your health care provider will ask you questions about your illness and your symptoms. Your medical history, along with a physical exam, is often all that is needed to diagnose pharyngitis. Sometimes, a rapid strep test is done. Other lab tests may also be done, depending on the suspected cause.  TREATMENT  Viral pharyngitis will usually get better in 3-4 days without the use of medicine. Bacterial pharyngitis is treated with medicines that kill germs (antibiotics).  HOME CARE INSTRUCTIONS   Drink enough water and fluids to keep your urine clear or pale yellow.   Only take over-the-counter or prescription medicines as directed by your health care provider:   If you are prescribed antibiotics, make sure you finish them even if you start to feel better.   Do not take aspirin.   Get lots of rest.   Gargle with 8 oz of salt water ( tsp of salt per 1 qt of water) as often as every 1-2 hours to soothe your throat.   Throat lozenges (if you are not at risk for choking) or sprays may be used to soothe your throat. SEEK MEDICAL  CARE IF:   You have large, tender lumps in your neck.  You have a rash.  You cough up green, yellow-brown, or bloody spit. SEEK IMMEDIATE MEDICAL CARE IF:   Your neck becomes stiff.  You drool or are unable to swallow liquids.  You vomit or are unable to keep medicines or liquids down.  You have severe pain that does not go away with the use of recommended medicines.  You have trouble breathing (not caused by a stuffy nose). MAKE SURE YOU:   Understand these instructions.  Will watch your condition.  Will get help right away if you are not doing well or get worse. Document Released: 01/19/2005 Document Revised: 11/09/2012 Document Reviewed: 09/26/2012 [facility] Patient Information 2015 [place], [place]. This information is not intended to replace advice given to you by your health care provider. Make sure you discuss any questions you have with your health care provider.  Would discontinue decongestive use at this time as this may be raising your blood pressure Consider follow-up with Dr. Glori Bickers within one month to reassess blood pressure

## 2014-07-14 NOTE — Progress Notes (Signed)
Pre visit review using our clinic review tool, if applicable. No additional management support is needed unless otherwise documented below in the visit note. 

## 2014-07-16 ENCOUNTER — Ambulatory Visit (INDEPENDENT_AMBULATORY_CARE_PROVIDER_SITE_OTHER): Payer: BLUE CROSS/BLUE SHIELD | Admitting: Family Medicine

## 2014-07-16 ENCOUNTER — Encounter: Payer: Self-pay | Admitting: Family Medicine

## 2014-07-16 VITALS — BP 166/94 | HR 101 | Temp 98.8°F | Ht 63.0 in | Wt 151.0 lb

## 2014-07-16 DIAGNOSIS — I10 Essential (primary) hypertension: Secondary | ICD-10-CM

## 2014-07-16 MED ORDER — HYDROCHLOROTHIAZIDE 25 MG PO TABS: 25.0000 mg | ORAL_TABLET | Freq: Every day | ORAL | Status: DC

## 2014-07-16 NOTE — Progress Notes (Signed)
 Subjective:    Patient ID: Ellen [name], female    DOB: 02/15/1970, 44 y.o.   MRN: [id number]  HPI Here for elevated bp   Was high when she saw Dr Bruchette  for ST That is improving    Now high again   BP Readings from Last 3 Encounters:  07/16/14 166/94  07/14/14 160/100  03/28/14 128/88    No symptoms from it  No flushing No swollen feet or ankles   Has been working on her wt - lost 11 lb by her scale at least  Cutting sugar out of diet  Hyperglycemia Lab Results  Component Value Date   HGBA1C 7.0* 03/21/2014    Watching blood sugar  Some exercise -before she got sick / walking and toning   Family hx -MGM with HTN   Patient Active Problem List   Diagnosis Date Noted  . Routine general medical examination at a health care facility 03/20/2014  . Anemia, iron deficiency 05/04/2012  . Elevated blood pressure (not hypertension) 05/04/2012  . Seborrheic dermatitis 09/25/2011  . GANGLION CYST, WRIST, LEFT 03/03/2010  . COMMON MIGRAINE 06/22/2008  . Diabetes type 2, controlled 02/15/2008  . DIABETES MELLITUS, GESTATIONAL, HX OF 02/15/2008  . Hyperlipidemia 10/01/2006  . Sinusitis, chronic 10/01/2006  . ALLERGIC RHINITIS 10/01/2006  . ALOPECIA 10/01/2006   Past Medical History  Diagnosis Date  . Allergic rhinitis due to pollen   . Hyperlipidemia   . DM (diabetes mellitus), gestational   . 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 Migraine   . Seborrheic dermatitis    Past Surgical History  Procedure Laterality Date  . Pilonidal cyst excision    . Uterine fibroid surgery    . Hystosalpingogram    . Cesarean section      x 2    History  Substance Use Topics  . Smoking status: Never Smoker   . Smokeless tobacco: Never Used  . Alcohol Use: No   Family History  Problem Relation Age of Onset  . Liver cancer Mother   . Diabetes Father   . Heart disease Father   . Migraines Sister   . Migraines Sister   . Migraines Sister   . Colon  cancer Neg Hx    No Known Allergies Current Outpatient Prescriptions on File Prior to Visit  Medication Sig Dispense Refill  . Blood Glucose Monitoring Suppl (FREESTYLE LITE) DEVI 1 EACH BY DOES NOT APPLY ROUTE AS NEEDED FOR OTHER. DX E11.9  0  . FeFum-FePo-FA-B Cmp-C-Zn-Mn-Cu (SE-TAN PLUS) 162-115.2-1 MG CAPS TAKE 1 CAPSULE EVERY DAY 30 capsule 2  . glucose blood (FREESTYLE TEST STRIPS) test strip Use as instructed 100 each 12  . glucose monitoring kit (FREESTYLE) monitoring kit 1 each by Does not apply route as needed for other. DX E11.9 1 each 0  . Lancets (FREESTYLE) lancets Use as instructed 100 each 12  . levocetirizine (XYZAL) 5 MG tablet Take 5 mg by mouth every evening.  6  . montelukast (SINGULAIR) 10 MG tablet Take 10 mg by mouth daily.  2  . Prenatal Vit-Fe Fumarate-FA (PRENATAL MULTIVITAMIN) TABS tablet Take 1 tablet by mouth daily at 12 noon.     No current facility-administered medications on file prior to visit.      Is sick with virus however   No nsaids since sat   Review of Systems    Review of Systems  Constitutional: Negative for fever, appetite change, fatigue and unexpected weight change.    ENT pos for cong/st /rhinorrhea  Eyes: Negative for pain and visual disturbance.  Respiratory: Negative for wheeze  and shortness of breath.   Cardiovascular: Negative for cp or palpitations    Gastrointestinal: Negative for nausea, diarrhea and constipation.  Genitourinary: Negative for urgency and frequency.  Skin: Negative for pallor or rash   Neurological: Negative for weakness, light-headedness, numbness and headaches.  Hematological: Negative for adenopathy. Does not bruise/bleed easily.  Psychiatric/Behavioral: Negative for dysphoric mood. The patient is not nervous/anxious.      Objective:   Physical Exam  Constitutional: She appears well-developed and well-nourished. No distress.  Well appearing  HENT:  Head: Normocephalic and atraumatic.  Mouth/Throat:  Oropharynx is clear and moist.  Eyes: Conjunctivae and EOM are normal. Pupils are equal, round, and reactive to light.  Neck: Normal range of motion. Neck supple. No JVD present. Carotid bruit is not present. No thyromegaly present.  Cardiovascular: Normal rate, regular rhythm, normal heart sounds and intact distal pulses.  Exam reveals no gallop.   Pulmonary/Chest: Effort normal and breath sounds normal. No respiratory distress. She has no wheezes. She has no rales.  No crackles  Abdominal: Soft. Bowel sounds are normal. She exhibits no distension, no abdominal bruit and no mass. There is no tenderness.  Musculoskeletal: She exhibits no edema.  Lymphadenopathy:    She has no cervical adenopathy.  Neurological: She is alert. She has normal reflexes.  Skin: Skin is warm and dry. No rash noted.  Psychiatric: She has a normal mood and affect.          Assessment & Plan:   Problem List Items Addressed This Visit    Essential hypertension - Primary    New HTN  Family hx Working hard on wt loss and lifestyle change Rev latest labs Start hctz 25 mg daily -disc poss side eff/update  F/u 2-3 wk for visit and lab that day      Relevant Medications   hydrochlorothiazide (HYDRODIURIL) 25 MG tablet      

## 2014-07-16 NOTE — Assessment & Plan Note (Signed)
New HTN  Family hx Working hard on wt loss and lifestyle change Rev latest labs Start hctz 25 mg daily -disc poss side eff/update  F/u 2-3 wk for visit and lab that day

## 2014-07-16 NOTE — Progress Notes (Signed)
Pre visit review using our clinic review tool, if applicable. No additional management support is needed unless otherwise documented below in the visit note. 

## 2014-07-16 NOTE — Patient Instructions (Signed)
I think you have new high blood pressure (hypertension) Keep working on health diet and exercise Start HCTZ 25 mg daily in am  If any side effects or problems let me know Follow up in 2-3 weeks for visit and we will do labs that day

## 2014-08-01 ENCOUNTER — Encounter: Payer: Self-pay | Admitting: Family Medicine

## 2014-08-01 ENCOUNTER — Ambulatory Visit (INDEPENDENT_AMBULATORY_CARE_PROVIDER_SITE_OTHER): Payer: BLUE CROSS/BLUE SHIELD | Admitting: Family Medicine

## 2014-08-01 VITALS — BP 128/84 | HR 87 | Temp 98.7°F | Ht 63.0 in | Wt 153.5 lb

## 2014-08-01 DIAGNOSIS — I10 Essential (primary) hypertension: Secondary | ICD-10-CM

## 2014-08-01 DIAGNOSIS — E119 Type 2 diabetes mellitus without complications: Secondary | ICD-10-CM | POA: Diagnosis not present

## 2014-08-01 NOTE — Patient Instructions (Signed)
Follow up for labs - call to make a lab appt on Tuesday  BP is great

## 2014-08-01 NOTE — Assessment & Plan Note (Signed)
Much imp on hctz  Will return for lab -no time today  Enc DASH diet/exercise BP: 128/84 mmHg

## 2014-08-01 NOTE — Assessment & Plan Note (Signed)
Watching carbs and exercising  Ate worse on a cruise-otherwise doing well  A1C planned for next week

## 2014-08-01 NOTE — Progress Notes (Signed)
Subjective:    Patient ID: Ellen [name], female    DOB: May 18, 1970, 44 y.o.   MRN: [id number]  HPI Here for f/u of new HTN   Last visit began HCTZ 25 mg daily  bp is improved  today  No cp or palpitations or headaches or edema  No side effects to medicines  BP Readings from Last 3 Encounters:  08/01/14 128/84  07/16/14 166/94  07/14/14 160/100      At first she was a little nauseated with med- now ok  Takes it with food   Feels less bloated and swollen  The frequent urination is not too bad She drinks lots of water   Patient Active Problem List   Diagnosis Date Noted  . Essential hypertension 07/16/2014  . Routine general medical examination at a health care facility 03/20/2014  . Anemia, iron deficiency 05/04/2012  . Elevated blood pressure (not hypertension) 05/04/2012  . Seborrheic dermatitis 09/25/2011  . GANGLION CYST, WRIST, LEFT 03/03/2010  . COMMON MIGRAINE 06/22/2008  . Diabetes type 2, controlled 02/15/2008  . DIABETES MELLITUS, GESTATIONAL, HX OF 02/15/2008  . Hyperlipidemia 10/01/2006  . Sinusitis, chronic 10/01/2006  . ALLERGIC RHINITIS 10/01/2006  . ALOPECIA 10/01/2006   Past Medical History  Diagnosis Date  . Allergic rhinitis due to pollen   . Hyperlipidemia   . DM (diabetes mellitus), gestational   . 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 Migraine   . Seborrheic dermatitis   . Hypertension    Past Surgical History  Procedure Laterality Date  . Pilonidal cyst excision    . Uterine fibroid surgery    . Hystosalpingogram    . Cesarean section      x 2    History  Substance Use Topics  . Smoking status: Never Smoker   . Smokeless tobacco: Never Used  . Alcohol Use: No   Family History  Problem Relation Age of Onset  . Liver cancer Mother   . Diabetes Father   . Heart disease Father   . Migraines Sister   . Migraines Sister   . Migraines Sister   . Colon cancer Neg Hx    No Known Allergies Current Outpatient  Prescriptions on File Prior to Visit  Medication Sig Dispense Refill  . Blood Glucose Monitoring Suppl (FREESTYLE LITE) DEVI 1 EACH BY DOES NOT APPLY ROUTE AS NEEDED FOR OTHER. DX E11.9  0  . FeFum-FePo-FA-B Cmp-C-Zn-Mn-Cu (SE-TAN PLUS) 162-115.2-1 MG CAPS TAKE 1 CAPSULE EVERY DAY 30 capsule 2  . glucose blood (FREESTYLE TEST STRIPS) test strip Use as instructed 100 each 12  . glucose monitoring kit (FREESTYLE) monitoring kit 1 each by Does not apply route as needed for other. DX E11.9 1 each 0  . hydrochlorothiazide (HYDRODIURIL) 25 MG tablet Take 1 tablet (25 mg total) by mouth daily. 30 tablet 3  . Lancets (FREESTYLE) lancets Use as instructed 100 each 12  . levocetirizine (XYZAL) 5 MG tablet Take 5 mg by mouth every evening.  6  . montelukast (SINGULAIR) 10 MG tablet Take 10 mg by mouth daily.  2  . Prenatal Vit-Fe Fumarate-FA (PRENATAL MULTIVITAMIN) TABS tablet Take 1 tablet by mouth daily at 12 noon.     No current facility-administered medications on file prior to visit.     Review of Systems Review of Systems  Constitutional: Negative for fever, appetite change, fatigue and unexpected weight change.  Eyes: Negative for pain and visual disturbance.  Respiratory: Negative for cough  and shortness of breath.   Cardiovascular: Negative for cp or palpitations    Gastrointestinal: Negative for nausea, diarrhea and constipation.  Genitourinary: Negative for urgency and frequency.  Skin: Negative for pallor or rash   Neurological: Negative for weakness, light-headedness, numbness and headaches.  Hematological: Negative for adenopathy. Does not bruise/bleed easily.  Psychiatric/Behavioral: Negative for dysphoric mood. The patient is not nervous/anxious.         Objective:   Physical Exam  Constitutional: She appears well-developed and well-nourished. No distress.  HENT:  Head: Normocephalic and atraumatic.  Mouth/Throat: Oropharynx is clear and moist.  Eyes: Conjunctivae and EOM are  normal. Pupils are equal, round, and reactive to light.  Neck: Normal range of motion. Neck supple. No JVD present. Carotid bruit is not present. No thyromegaly present.  Cardiovascular: Normal rate, regular rhythm, normal heart sounds and intact distal pulses.  Exam reveals no gallop.   Pulmonary/Chest: Effort normal and breath sounds normal. No respiratory distress. She has no wheezes. She has no rales.  No crackles  Abdominal: Soft. Bowel sounds are normal. She exhibits no distension, no abdominal bruit and no mass. There is no tenderness.  Musculoskeletal: She exhibits no edema.  Lymphadenopathy:    She has no cervical adenopathy.  Neurological: She is alert. She has normal reflexes.  Skin: Skin is warm and dry. No rash noted.  Psychiatric: She has a normal mood and affect.          Assessment & Plan:   Problem List Items Addressed This Visit    Diabetes type 2, controlled    Watching carbs and exercising  Ate worse on a cruise-otherwise doing well  A1C planned for next week      Relevant Orders   Hemoglobin A1c   Essential hypertension - Primary    Much imp on hctz  Will return for lab -no time today  Enc DASH diet/exercise BP: 128/84 mmHg        Relevant Orders   Basic metabolic panel

## 2014-08-01 NOTE — Progress Notes (Signed)
Pre visit review using our clinic review tool, if applicable. No additional management support is needed unless otherwise documented below in the visit note. 

## 2014-12-04 ENCOUNTER — Telehealth: Payer: Self-pay | Admitting: Family Medicine

## 2014-12-04 ENCOUNTER — Ambulatory Visit (INDEPENDENT_AMBULATORY_CARE_PROVIDER_SITE_OTHER): Payer: BLUE CROSS/BLUE SHIELD

## 2014-12-04 ENCOUNTER — Other Ambulatory Visit (INDEPENDENT_AMBULATORY_CARE_PROVIDER_SITE_OTHER): Payer: BLUE CROSS/BLUE SHIELD

## 2014-12-04 DIAGNOSIS — E119 Type 2 diabetes mellitus without complications: Secondary | ICD-10-CM

## 2014-12-04 DIAGNOSIS — Z23 Encounter for immunization: Secondary | ICD-10-CM | POA: Diagnosis not present

## 2014-12-04 DIAGNOSIS — Z0000 Encounter for general adult medical examination without abnormal findings: Secondary | ICD-10-CM

## 2014-12-04 DIAGNOSIS — N926 Irregular menstruation, unspecified: Secondary | ICD-10-CM | POA: Diagnosis not present

## 2014-12-04 NOTE — Telephone Encounter (Signed)
cpx labs  

## 2014-12-05 ENCOUNTER — Ambulatory Visit (INDEPENDENT_AMBULATORY_CARE_PROVIDER_SITE_OTHER): Payer: BLUE CROSS/BLUE SHIELD | Admitting: Family Medicine

## 2014-12-05 ENCOUNTER — Encounter: Payer: Self-pay | Admitting: Family Medicine

## 2014-12-05 VITALS — BP 136/82 | HR 90 | Temp 98.3°F | Ht 63.0 in | Wt 150.1 lb

## 2014-12-05 DIAGNOSIS — E119 Type 2 diabetes mellitus without complications: Secondary | ICD-10-CM | POA: Diagnosis not present

## 2014-12-05 DIAGNOSIS — E785 Hyperlipidemia, unspecified: Secondary | ICD-10-CM

## 2014-12-05 DIAGNOSIS — I10 Essential (primary) hypertension: Secondary | ICD-10-CM | POA: Diagnosis not present

## 2014-12-05 LAB — LIPID PANEL
CHOLESTEROL: 191 mg/dL (ref 0–200)
HDL: 66.2 mg/dL (ref >39.00)
LDL Cholesterol: 113 mg/dL — ABNORMAL HIGH (ref 0–99)
NonHDL: 125.21
TRIGLYCERIDES: 63 mg/dL (ref 0.0–149.0)
Total CHOL/HDL Ratio: 3
VLDL: 12.6 mg/dL (ref 0.0–40.0)

## 2014-12-05 LAB — CBC WITH DIFFERENTIAL/PLATELET
BASOS ABS: 0.2 10*3/uL — AB (ref 0.0–0.1)
Basophils Relative: 2 % (ref 0.0–3.0)
EOS PCT: 4.1 % (ref 0.0–5.0)
Eosinophils Absolute: 0.3 10*3/uL (ref 0.0–0.7)
HCT: 39.1 % (ref 36.0–46.0)
Hemoglobin: 12.5 g/dL (ref 12.0–15.0)
LYMPHS PCT: 28.7 % (ref 12.0–46.0)
Lymphs Abs: 2.2 10*3/uL (ref 0.7–4.0)
MCHC: 31.9 g/dL (ref 30.0–36.0)
MCV: 77.7 fl — AB (ref 78.0–100.0)
MONOS PCT: 7.7 % (ref 3.0–12.0)
Monocytes Absolute: 0.6 10*3/uL (ref 0.1–1.0)
Neutro Abs: 4.4 10*3/uL (ref 1.4–7.7)
Neutrophils Relative %: 57.5 % (ref 43.0–77.0)
PLATELETS: 256 10*3/uL (ref 150.0–400.0)
RBC: 5.03 Mil/uL (ref 3.87–5.11)
RDW: 15.8 % — ABNORMAL HIGH (ref 11.5–15.5)
WBC: 7.6 10*3/uL (ref 4.0–10.5)

## 2014-12-05 LAB — COMPREHENSIVE METABOLIC PANEL
ALK PHOS: 65 U/L (ref 39–117)
ALT: 10 U/L (ref 0–35)
AST: 14 U/L (ref 0–37)
Albumin: 4.3 g/dL (ref 3.5–5.2)
BILIRUBIN TOTAL: 0.4 mg/dL (ref 0.2–1.2)
BUN: 16 mg/dL (ref 6–23)
CALCIUM: 9.8 mg/dL (ref 8.4–10.5)
CO2: 30 mEq/L (ref 19–32)
Chloride: 98 mEq/L (ref 96–112)
Creatinine, Ser: 0.88 mg/dL (ref 0.40–1.20)
GFR: 89.05 mL/min (ref >60.00)
Glucose, Bld: 78 mg/dL (ref 70–99)
Potassium: 3.7 mEq/L (ref 3.5–5.1)
Sodium: 135 mEq/L (ref 135–145)
TOTAL PROTEIN: 8.3 g/dL (ref 6.0–8.3)

## 2014-12-05 LAB — TSH: TSH: 1.09 u[IU]/mL (ref 0.35–4.50)

## 2014-12-05 LAB — HCG, QUANTITATIVE, PREGNANCY: QUANTITATIVE HCG: 0.14 m[IU]/mL

## 2014-12-05 LAB — HEMOGLOBIN A1C: Hgb A1c MFr Bld: 6.5 % (ref 4.6–6.5)

## 2014-12-05 MED ORDER — HYDROCHLOROTHIAZIDE 25 MG PO TABS: 25.0000 mg | ORAL_TABLET | Freq: Every day | ORAL | Status: DC

## 2014-12-05 MED ORDER — MONTELUKAST SODIUM 10 MG PO TABS: 10.0000 mg | ORAL_TABLET | Freq: Every day | ORAL | Status: DC

## 2014-12-05 MED ORDER — LEVOCETIRIZINE DIHYDROCHLORIDE 5 MG PO TABS: 5.0000 mg | ORAL_TABLET | Freq: Every evening | ORAL | Status: DC

## 2014-12-05 NOTE — Progress Notes (Signed)
Subjective:    Patient ID: Ellen [name], female    DOB: Apr 18, 1970, 44 y.o.   MRN: [id number]  HPI Here for f/u of chronic medical problems   Wt is down 3 lb with bmi of 26 Trying to get into an exercise routine  Eating is much different - choosing better carbs /staying away from sweets most of the time  Does not use sugar to cook   bp is stable today (she did miss some days of hctz)  No cp or palpitations or headaches or edema  No side effects to medicines  BP Readings from Last 3 Encounters:  12/05/14 136/82  08/01/14 128/84  07/16/14 166/94       Chemistry      Component Value Date/Time   NA 135 12/04/2014 1559   K 3.7 12/04/2014 1559   CL 98 12/04/2014 1559   CO2 30 12/04/2014 1559   BUN 16 12/04/2014 1559   CREATININE 0.88 12/04/2014 1559      Component Value Date/Time   CALCIUM 9.8 12/04/2014 1559   CALCIUM 8.8 03/25/2009 2208   ALKPHOS 65 12/04/2014 1559   AST 14 12/04/2014 1559   ALT 10 12/04/2014 1559   BILITOT 0.4 12/04/2014 1559       DM2 Lab Results  Component Value Date   HGBA1C 6.5 12/04/2014  improved from 7.0 - has really done well  Good control  Diet/exercise   Had her flu shot   req serum preg test- negative  Her period was late-it started yesterday      Cholesterol Lab Results  Component Value Date   CHOL 191 12/04/2014   CHOL 203* 03/21/2014   CHOL 207* 03/18/2010   Lab Results  Component Value Date   HDL 66.20 12/04/2014   HDL 57.00 03/21/2014   HDL 66.50 03/18/2010   Lab Results  Component Value Date   LDLCALC 113* 12/04/2014   LDLCALC 131* 03/21/2014   LDLCALC 99 01/23/2009   Lab Results  Component Value Date   TRIG 63.0 12/04/2014   TRIG 73.0 03/21/2014   TRIG 49.0 03/18/2010   Lab Results  Component Value Date   CHOLHDL 3 12/04/2014   CHOLHDL 4 03/21/2014   CHOLHDL 3 03/18/2010   Lab Results  Component Value Date   LDLDIRECT 125.4 03/18/2010   improved HDL and LDL  Trying to get in more  exercise - will start doing some in the am   Patient Active Problem List   Diagnosis Date Noted  . Irregular menses 12/04/2014  . Essential hypertension 07/16/2014  . Routine general medical examination at a health care facility 03/20/2014  . Anemia, iron deficiency 05/04/2012  . Elevated blood pressure (not hypertension) 05/04/2012  . Seborrheic dermatitis 09/25/2011  . GANGLION CYST, WRIST, LEFT 03/03/2010  . COMMON MIGRAINE 06/22/2008  . Diabetes type 2, controlled ([place]) 02/15/2008  . DIABETES MELLITUS, GESTATIONAL, HX OF 02/15/2008  . Hyperlipidemia 10/01/2006  . Sinusitis, chronic 10/01/2006  . ALLERGIC RHINITIS 10/01/2006  . ALOPECIA 10/01/2006   Past Medical History  Diagnosis Date  . Allergic rhinitis due to pollen   . Hyperlipidemia   . DM (diabetes mellitus), gestational   . 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 Migraine   . Seborrheic dermatitis   . Hypertension    Past Surgical History  Procedure Laterality Date  . Pilonidal cyst excision    . Uterine fibroid surgery    . Hystosalpingogram    . Cesarean section  x 2    Social History  Substance Use Topics  . Smoking status: Never Smoker   . Smokeless tobacco: Never Used  . Alcohol Use: No   Family History  Problem Relation Age of Onset  . Liver cancer Mother   . Diabetes Father   . Heart disease Father   . Migraines Sister   . Migraines Sister   . Migraines Sister   . Colon cancer Neg Hx    No Known Allergies Current Outpatient Prescriptions on File Prior to Visit  Medication Sig Dispense Refill  . Blood Glucose Monitoring Suppl (FREESTYLE LITE) DEVI 1 EACH BY DOES NOT APPLY ROUTE AS NEEDED FOR OTHER. DX E11.9  0  . FeFum-FePo-FA-B Cmp-C-Zn-Mn-Cu (SE-TAN PLUS) 162-115.2-1 MG CAPS TAKE 1 CAPSULE EVERY DAY 30 capsule 2  . glucose blood (FREESTYLE TEST STRIPS) test strip Use as instructed 100 each 12  . glucose monitoring kit (FREESTYLE) monitoring kit 1 each by Does not apply route  as needed for other. DX E11.9 1 each 0  . Lancets (FREESTYLE) lancets Use as instructed 100 each 12  . Prenatal Vit-Fe Fumarate-FA (PRENATAL MULTIVITAMIN) TABS tablet Take 1 tablet by mouth daily at 12 noon.     No current facility-administered medications on file prior to visit.    Review of Systems Review of Systems  Constitutional: Negative for fever, appetite change, fatigue and unexpected weight change.  Eyes: Negative for pain and visual disturbance.  Respiratory: Negative for cough and shortness of breath.   Cardiovascular: Negative for cp or palpitations    Gastrointestinal: Negative for nausea, diarrhea and constipation.  Genitourinary: Negative for urgency and frequency. Pos for menses that was late ,neg for excessive thirst or urination  Skin: Negative for pallor or rash   Neurological: Negative for weakness, light-headedness, numbness and headaches.  Hematological: Negative for adenopathy. Does not bruise/bleed easily.  Psychiatric/Behavioral: Negative for dysphoric mood. The patient is not nervous/anxious.         Objective:   Physical Exam  Constitutional: She appears well-developed and well-nourished. No distress.  Well appearing   HENT:  Head: Normocephalic and atraumatic.  Mouth/Throat: Oropharynx is clear and moist.  Eyes: Conjunctivae and EOM are normal. Pupils are equal, round, and reactive to light.  Neck: Normal range of motion. Neck supple. No JVD present. Carotid bruit is not present. No thyromegaly present.  Cardiovascular: Normal rate, regular rhythm, normal heart sounds and intact distal pulses.  Exam reveals no gallop.   Pulmonary/Chest: Effort normal and breath sounds normal. No respiratory distress. She has no wheezes. She has no rales.  No crackles  Abdominal: Soft. Bowel sounds are normal. She exhibits no distension, no abdominal bruit and no mass. There is no tenderness.  Musculoskeletal: She exhibits no edema.  Lymphadenopathy:    She has no  cervical adenopathy.  Neurological: She is alert. She has normal reflexes.  Skin: Skin is warm and dry. No rash noted.  Psychiatric: She has a normal mood and affect.          Assessment & Plan:

## 2014-12-05 NOTE — Progress Notes (Signed)
Pre visit review using our clinic review tool, if applicable. No additional management support is needed unless otherwise documented below in the visit note. 

## 2014-12-05 NOTE — Patient Instructions (Signed)
I'm glad you are doing well  Keep up the good diet Gradually incorporate exercise  Labs are  improved   Follow up in 6 months with labs prior

## 2014-12-06 NOTE — Assessment & Plan Note (Signed)
bp in fair control at this time  BP Readings from Last 1 Encounters:  12/05/14 136/82   No changes needed Disc lifstyle change with low sodium diet and exercise  Labs reviewed

## 2014-12-06 NOTE — Assessment & Plan Note (Signed)
Disc goals for lipids and reasons to control them Rev labs with pt Rev low sat fat diet in detail  LDL is down- with better diet  Working on exercise next

## 2014-12-06 NOTE — Assessment & Plan Note (Signed)
Improved with diet change Lab Results  Component Value Date   HGBA1C 6.5 12/04/2014   Almost below DM range  Urged to keep up the good work Will add exercise as able - working up to 5 d per week  Also working on wt loss

## 2015-01-10 ENCOUNTER — Other Ambulatory Visit: Payer: Self-pay | Admitting: Family Medicine

## 2015-01-10 ENCOUNTER — Telehealth: Payer: Self-pay

## 2015-01-10 NOTE — Telephone Encounter (Signed)
Pt left v/m; pt request Dr Glori Bickers to write appeal letter of necessity for pt to get SE Tan plus. Ins will only pay for med with letter of appeal faxed to [phone].pt request cb.

## 2015-01-11 NOTE — Telephone Encounter (Signed)
Pt said she is taking Rx for anemia and iron def., pt said you referred her to GI about 3 years ago and it was the GI doc who originally prescribed med. Pt had already tried OTC iron, nu iron, liquid iron and many other iron productions over the last 10 yrs., and they didn't help her sxs of the anemia/ iron def. Pt said before starting this iron she was very exhausted all the time, and some times felt like she was going to faint especially during her period. Pt said this is the 1st iron supplement that she has taken that helps her sxs and also doesn't cause her to become constipated and would like to stay on med if Dr. Glori Bickers is okay with doing the appeal letter

## 2015-01-11 NOTE — Telephone Encounter (Signed)
I think she takes it for anemia, correct?   Who originally px it?  Is there a reason she cannot take a more standard iron pill like ferrous sulfate or nu iron ? (has she had reactions or did they not work?)  Thanks

## 2015-01-11 NOTE — Telephone Encounter (Signed)
Letter done and in IN box 

## 2015-01-11 NOTE — Telephone Encounter (Signed)
Appeal letter faxed to # provided in this message

## 2015-01-15 NOTE — Telephone Encounter (Signed)
Please let her know it was still denied - they just do not cover that med under any circumstances I am happy to write for it if she wants to pay out of pocket Otherwise if she gets me a list of covered alternatives we can try to pick something

## 2015-01-15 NOTE — Telephone Encounter (Signed)
Pt request cb with status of SE tan plus.

## 2015-01-15 NOTE — Telephone Encounter (Signed)
Received fax saying that med is not a covered med under pt's insurance so appeal was denied, letter placed in your inbox

## 2015-01-16 NOTE — Telephone Encounter (Signed)
Left voicemail requesting pt to call office back 

## 2015-01-31 NOTE — Telephone Encounter (Signed)
Pt notified, and  wants to pay out of pocket for RX/tvw

## 2015-05-27 ENCOUNTER — Ambulatory Visit (INDEPENDENT_AMBULATORY_CARE_PROVIDER_SITE_OTHER): Payer: BLUE CROSS/BLUE SHIELD | Admitting: Family Medicine

## 2015-05-27 ENCOUNTER — Encounter: Payer: Self-pay | Admitting: Family Medicine

## 2015-05-27 VITALS — BP 158/96 | HR 85 | Temp 98.4°F | Ht 63.0 in | Wt 143.8 lb

## 2015-05-27 DIAGNOSIS — D259 Leiomyoma of uterus, unspecified: Secondary | ICD-10-CM | POA: Diagnosis not present

## 2015-05-27 DIAGNOSIS — N946 Dysmenorrhea, unspecified: Secondary | ICD-10-CM | POA: Insufficient documentation

## 2015-05-27 DIAGNOSIS — D219 Benign neoplasm of connective and other soft tissue, unspecified: Secondary | ICD-10-CM | POA: Insufficient documentation

## 2015-05-27 DIAGNOSIS — E119 Type 2 diabetes mellitus without complications: Secondary | ICD-10-CM | POA: Diagnosis not present

## 2015-05-27 DIAGNOSIS — I10 Essential (primary) hypertension: Secondary | ICD-10-CM | POA: Diagnosis not present

## 2015-05-27 MED ORDER — AMLODIPINE BESYLATE 5 MG PO TABS: 5.0000 mg | ORAL_TABLET | Freq: Every day | ORAL | Status: DC

## 2015-05-27 NOTE — Progress Notes (Signed)
Subjective:    Patient ID: Ellen [name], female    DOB: 08-21-1970, 45 y.o.   MRN: [id number]  HPI Here for side eff to bp med (HCTZ) It made her really nauseated (after a while) Not vomiting but almost   Would occ get hives as well-- but gets those occ- ? Timing  No mouth swelling or sob   No chance pregnant  Is having nl menses now   Exercises 3-5 times per week  Wt is down 7 lb with bmi of 25   BP Readings from Last 3 Encounters:  05/27/15 158/96  12/05/14 136/82  08/01/14 128/84    Otherwise feeling ok   She also wants to see gyn /has hx of fibroids and periods are changing  [facility] and wants to change to a less busy practice Is interested in going to [place]   Patient Active Problem List   Diagnosis Date Noted  . Irregular menses 12/04/2014  . Essential hypertension 07/16/2014  . Routine general medical examination at a health care facility 03/20/2014  . Anemia, iron deficiency 05/04/2012  . Elevated blood pressure (not hypertension) 05/04/2012  . Seborrheic dermatitis 09/25/2011  . GANGLION CYST, WRIST, LEFT 03/03/2010  . COMMON MIGRAINE 06/22/2008  . Diabetes type 2, controlled ([place]) 02/15/2008  . DIABETES MELLITUS, GESTATIONAL, HX OF 02/15/2008  . Hyperlipidemia 10/01/2006  . Sinusitis, chronic 10/01/2006  . ALLERGIC RHINITIS 10/01/2006  . ALOPECIA 10/01/2006   Past Medical History  Diagnosis Date  . Allergic rhinitis due to pollen   . Hyperlipidemia   . DM (diabetes mellitus), gestational   . 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 Migraine   . Seborrheic dermatitis   . Hypertension    Past Surgical History  Procedure Laterality Date  . Pilonidal cyst excision    . Uterine fibroid surgery    . Hystosalpingogram    . Cesarean section      x 2    Social History  Substance Use Topics  . Smoking status: Never Smoker   . Smokeless tobacco: Never Used  . Alcohol Use: No   Family History  Problem  Relation Age of Onset  . Liver cancer Mother   . Diabetes Father   . Heart disease Father   . Migraines Sister   . Migraines Sister   . Migraines Sister   . Colon cancer Neg Hx    No Known Allergies Current Outpatient Prescriptions on File Prior to Visit  Medication Sig Dispense Refill  . Blood Glucose Monitoring Suppl (FREESTYLE LITE) DEVI 1 EACH BY DOES NOT APPLY ROUTE AS NEEDED FOR OTHER. DX E11.9  0  . FeFum-FePo-FA-B Cmp-C-Zn-Mn-Cu (SE-TAN PLUS) 162-115.2-1 MG CAPS TAKE 1 CAPSULE EVERY DAY 30 capsule 11  . glucose blood (FREESTYLE TEST STRIPS) test strip Use as instructed 100 each 12  . glucose monitoring kit (FREESTYLE) monitoring kit 1 each by Does not apply route as needed for other. DX E11.9 1 each 0  . Lancets (FREESTYLE) lancets Use as instructed 100 each 12  . levocetirizine (XYZAL) 5 MG tablet Take 1 tablet (5 mg total) by mouth every evening. 90 tablet 3  . montelukast (SINGULAIR) 10 MG tablet Take 1 tablet (10 mg total) by mouth daily. 90 tablet 3  . Prenatal Vit-Fe Fumarate-FA (PRENATAL MULTIVITAMIN) TABS tablet Take 1 tablet by mouth daily at 12 noon.     No current facility-administered medications on file prior to visit.  Review of Systems Review of Systems  Constitutional: Negative for fever, appetite change, fatigue and unexpected weight change.  Eyes: Negative for pain and visual disturbance.  Respiratory: Negative for cough and shortness of breath.   Cardiovascular: Negative for cp or palpitations    Gastrointestinal: Negative for nausea, diarrhea and constipation.  Genitourinary: Negative for urgency and frequency.  Skin: Negative for pallor or rash   Neurological: Negative for weakness, light-headedness, numbness and headaches.  Hematological: Negative for adenopathy. Does not bruise/bleed easily.  Psychiatric/Behavioral: Negative for dysphoric mood. The patient is not nervous/anxious.  pt voices frustration with trying to loose abdominal weight         Objective:   Physical Exam  Constitutional: She appears well-developed and well-nourished. No distress.  Well appearing   HENT:  Head: Normocephalic and atraumatic.  Mouth/Throat: Oropharynx is clear and moist.  Eyes: Conjunctivae and EOM are normal. Pupils are equal, round, and reactive to light.  Neck: Normal range of motion. Neck supple. No JVD present. Carotid bruit is not present. No thyromegaly present.  Cardiovascular: Normal rate, regular rhythm, normal heart sounds and intact distal pulses.  Exam reveals no gallop.   Pulmonary/Chest: Effort normal and breath sounds normal. No respiratory distress. She has no wheezes. She has no rales.  No crackles  Abdominal: Soft. Bowel sounds are normal. She exhibits no distension, no abdominal bruit and no mass. There is no tenderness. There is no rebound and no guarding.  Musculoskeletal: She exhibits no edema.  Lymphadenopathy:    She has no cervical adenopathy.  Neurological: She is alert. She has normal reflexes. No cranial nerve deficit. She exhibits normal muscle tone. Coordination normal.  Skin: Skin is warm and dry. No rash noted.  Psychiatric: She has a normal mood and affect.          Assessment & Plan:   Problem List Items Addressed This Visit      Cardiovascular and Mediastinum   Essential hypertension - Primary    Nausea with HCTZ -better off of it  bp is still elevated despite great diet/exercise/wt loss Will try amlodipine 5 mg daily - rev poss side eff/will update F/u 2-3 mo       Relevant Medications   amLODipine (NORVASC) 5 MG tablet     Endocrine   Diabetes type 2, controlled ([place])    Will check A1C before next visit Great health habits         Genitourinary   Fibroids   Relevant Orders   Ambulatory referral to Gynecology   Menses painful    Symptomatic menses with pain/ also GI symptoms and hx of fibroids Wants to change to a smaller gyn practice  Cannot take OC due to HTN  [facility] to [facility]        Relevant Orders   Ambulatory referral to Gynecology

## 2015-05-27 NOTE — Assessment & Plan Note (Signed)
Will check A1C before next visit Great health habits

## 2015-05-27 NOTE — Patient Instructions (Addendum)
Stay off the hctz since you are having side effects  Start amlodipine 5 mg once daily - if any problems or side effects let me know  Stop at check out for referral to gyn   Follow up with me in 2-3 months with labs prior

## 2015-05-27 NOTE — Assessment & Plan Note (Signed)
Symptomatic menses with pain/ also GI symptoms and hx of fibroids Wants to change to a smaller gyn practice  Cannot take OC due to HTN  Ref to [facility]

## 2015-05-27 NOTE — Progress Notes (Signed)
Pre visit review using our clinic review tool, if applicable. No additional management support is needed unless otherwise documented below in the visit note. 

## 2015-05-27 NOTE — Assessment & Plan Note (Signed)
Nausea with HCTZ -better off of it  bp is still elevated despite great diet/exercise/wt loss Will try amlodipine 5 mg daily - rev poss side eff/will update F/u 2-3 mo

## 2015-06-12 ENCOUNTER — Encounter: Payer: Self-pay | Admitting: Obstetrics & Gynecology

## 2015-06-12 ENCOUNTER — Ambulatory Visit (INDEPENDENT_AMBULATORY_CARE_PROVIDER_SITE_OTHER): Payer: BLUE CROSS/BLUE SHIELD | Admitting: Obstetrics & Gynecology

## 2015-06-12 VITALS — BP 138/83 | HR 80 | Resp 18 | Ht 63.0 in | Wt 147.0 lb

## 2015-06-12 DIAGNOSIS — K589 Irritable bowel syndrome without diarrhea: Secondary | ICD-10-CM

## 2015-06-12 MED ORDER — DICYCLOMINE HCL 10 MG PO CAPS: 10.0000 mg | ORAL_CAPSULE | Freq: Three times a day (TID) | ORAL | Status: DC

## 2015-06-12 NOTE — Progress Notes (Signed)
Patient ID: Ellen [name], female   DOB: 08/15/1970, 45 y.o.   MRN: [id number] History:  45 y.o. G2P2022 here today for eval of overactive bowels during the cycle.  She reports that it has occurred for the past year and a half. She reports that she has never had it at any other time.  She denies pain with her cycles she has been dx'd with fibroids but, does not feel that this is a problem.  She reports that she gets weak during this time.  She has a nutritious diet but, during this time tries not to eat because it increase her sx.     The following portions of the patient's history were reviewed and updated as appropriate: allergies, current medications, past family history, past medical history, past social history, past surgical history and problem list.  Review of Systems:  Pertinent items are noted in HPI.  Objective:  Physical Exam Blood pressure 138/83, pulse 80, resp. rate 18, height 5\' 3"  (1.6 m), weight 147 lb (66.679 kg), last menstrual period 05/24/2015. Gen: NAD Lungs: CTA CV: RRR Abd: Soft, nontender and nondistended Pelvic: Normal appearing external genitalia; normal appearing vaginal mucosa and cervix.  Normal discharge.  Small uterus- some fibroids palplable, no uterine or adnexal tenderness    Assessment & Plan:  Irritable bowel symptoms related to menses  Bentyl 10 3-4 x/days during the first 3 days of the cycle. Increased fiber in the diet  Ellen [name] L. Harraway-Smith, M.D., Cherlynn June

## 2015-06-12 NOTE — Patient Instructions (Addendum)
Diet for Irritable Bowel Syndrome When you have irritable bowel syndrome (IBS), the foods you eat and your eating habits are very important. IBS may cause various symptoms, such as abdominal pain, constipation, or diarrhea. Choosing the right foods can help ease discomfort caused by these symptoms. Work with your health care provider and dietitian to find the best eating plan to help control your symptoms. WHAT GENERAL GUIDELINES DO I NEED TO FOLLOW?  Keep a food diary. This will help you identify foods that cause symptoms. Write down:  What you eat and when.  What symptoms you have.  When symptoms occur in relation to your meals.  Avoid foods that cause symptoms. Talk with your dietitian about other ways to get the same nutrients that are in these foods.  Eat more foods that contain fiber. Take a fiber supplement if directed by your dietitian.  Eat your meals slowly, in a relaxed setting.  Aim to eat 5-6 small meals per day. Do not skip meals.  Drink enough fluids to keep your urine clear or pale yellow.  Ask your health care provider if you should take an over-the-counter probiotic during flare-ups to help restore healthy gut bacteria.  If you have cramping or diarrhea, try making your meals low in fat and high in carbohydrates. Examples of carbohydrates are pasta, rice, whole grain breads and cereals, fruits, and vegetables.  If dairy products cause your symptoms to flare up, try eating less of them. You might be able to handle yogurt better than other dairy products because it contains bacteria that help with digestion. WHAT FOODS ARE NOT RECOMMENDED? The following are some foods and drinks that may worsen your symptoms:  Fatty foods, such as French fries.  Milk products, such as cheese or ice cream.  Chocolate.  Alcohol.  Products with caffeine, such as coffee.  Carbonated drinks, such as soda. The items listed above may not be a complete list of foods and beverages to  avoid. Contact your dietitian for more information. WHAT FOODS ARE GOOD SOURCES OF FIBER? Your health care provider or dietitian may recommend that you eat more foods that contain fiber. Fiber can help reduce constipation and other IBS symptoms. Add foods with fiber to your diet a little at a time so that your body can get used to them. Too much fiber at once might cause gas and swelling of your abdomen. The following are some foods that are good sources of fiber:  Apples.  Peaches.  Pears.  Berries.  Figs.  Broccoli (raw).  Cabbage.  Carrots.  Raw peas.  Kidney beans.  Lima beans.  Whole grain bread.  Whole grain cereal. FOR MORE INFORMATION  International Foundation for Functional Gastrointestinal Disorders: www.iffgd.org National Institute of Diabetes and Digestive and Kidney Diseases: www.niddk.nih.gov/health-information/health-topics/digestive-diseases/ibs/Pages/facts.aspx   This information is not intended to replace advice given to you by your health care provider. Make sure you discuss any questions you have with your health care provider.   Document Released: 04/11/2003 Document Revised: 02/09/2014 Document Reviewed: 04/21/2013 Elsevier Interactive Patient Education 2016 Elsevier Inc.  

## 2015-06-19 ENCOUNTER — Ambulatory Visit: Payer: BLUE CROSS/BLUE SHIELD | Admitting: Obstetrics & Gynecology

## 2015-06-20 ENCOUNTER — Telehealth: Payer: Self-pay | Admitting: Family Medicine

## 2015-06-20 MED ORDER — FLUTICASONE PROPIONATE 50 MCG/ACT NA SUSP: 2.0000 | Freq: Every day | NASAL | Status: DC

## 2015-06-20 NOTE — Telephone Encounter (Signed)
Pt requesting Fluticasone Propionate (FLONASE ALLERGY RELIEF to be sent to [facility], she states insurance will pay with prescription.

## 2015-07-01 ENCOUNTER — Encounter (HOSPITAL_COMMUNITY): Payer: Self-pay | Admitting: *Deleted

## 2015-07-01 ENCOUNTER — Ambulatory Visit (HOSPITAL_COMMUNITY)
Admission: EM | Admit: 2015-07-01 | Discharge: 2015-07-01 | Disposition: A | Discharge status: Home / Self Care | Payer: BLUE CROSS/BLUE SHIELD | Attending: Family Medicine | Admitting: Family Medicine

## 2015-07-01 DIAGNOSIS — Z79899 Other long term (current) drug therapy: Secondary | ICD-10-CM | POA: Diagnosis not present

## 2015-07-01 DIAGNOSIS — J301 Allergic rhinitis due to pollen: Secondary | ICD-10-CM | POA: Diagnosis not present

## 2015-07-01 DIAGNOSIS — I10 Essential (primary) hypertension: Secondary | ICD-10-CM | POA: Diagnosis not present

## 2015-07-01 DIAGNOSIS — E785 Hyperlipidemia, unspecified: Secondary | ICD-10-CM | POA: Diagnosis not present

## 2015-07-01 DIAGNOSIS — J029 Acute pharyngitis, unspecified: Secondary | ICD-10-CM | POA: Diagnosis present

## 2015-07-01 LAB — POCT RAPID STREP A: STREPTOCOCCUS, GROUP A SCREEN (DIRECT): NEGATIVE

## 2015-07-01 MED ORDER — CETIRIZINE HCL 10 MG PO TABS: 10.0000 mg | ORAL_TABLET | Freq: Every day | ORAL | Status: DC

## 2015-07-01 MED ORDER — IPRATROPIUM BROMIDE 0.06 % NA SOLN: 2.0000 | Freq: Four times a day (QID) | NASAL | Status: AC

## 2015-07-01 NOTE — ED Notes (Addendum)
Pt reports     Symptoms     Of     sorethroat       l  Earache    With  Pain  When  She swallows   With  Onset  Of  Symptoms      X   10  Days

## 2015-07-01 NOTE — ED Provider Notes (Signed)
CSN: [id number]     Arrival date & time 07/01/15  1256 History   First MD Initiated Contact with Patient 07/01/15 1313     Chief Complaint  Patient presents with  . Sore Throat   (Consider location/radiation/quality/duration/timing/severity/associated sxs/prior Treatment) Patient is a 45 y.o. female presenting with pharyngitis. The history is provided by the patient.  Sore Throat This is a new problem. The current episode started more than 1 week ago. The problem has not changed since onset.Pertinent negatives include no chest pain.    Past Medical History  Diagnosis Date  . Allergic rhinitis due to pollen   . Hyperlipidemia   . DM (diabetes mellitus), gestational   . 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 Migraine   . Seborrheic dermatitis   . Hypertension    Past Surgical History  Procedure Laterality Date  . Pilonidal cyst excision    . Uterine fibroid surgery    . Hystosalpingogram    . Cesarean section      x 2    Family History  Problem Relation Age of Onset  . Liver cancer Mother   . Diabetes Father   . Heart disease Father   . Migraines Sister   . Migraines Sister   . Migraines Sister   . Colon cancer Neg Hx    Social History  Substance Use Topics  . Smoking status: Never Smoker   . Smokeless tobacco: Never Used  . Alcohol Use: No   OB History    Gravida Para Term Preterm AB TAB SAB Ectopic Multiple Living   _0 Review of Systems  Constitutional: Negative.  Negative for fever.  HENT: Positive for congestion, postnasal drip, rhinorrhea and sore throat.   Respiratory: Negative.   Cardiovascular: Negative.  Negative for chest pain.  All other systems reviewed and are negative.   Allergies  Hctz  Home Medications   Prior to Admission medications   Medication Sig Start Date End Date Taking? Authorizing Provider  amLODipine (NORVASC) 5 MG tablet Take 1 tablet (5 mg total) by mouth daily. Patient not taking: Reported on  06/12/2015 05/27/15   Abner Greenspan, MD  Blood Glucose Monitoring Suppl (FREESTYLE LITE) DEVI Reported on 06/12/2015 04/09/14   Historical Provider, MD  cetirizine (ZYRTEC) 10 MG tablet Take 1 tablet (10 mg total) by mouth daily. One tab daily for allergies 07/01/15   Billy Fischer, MD  dicyclomine (BENTYL) 10 MG capsule Take 1 capsule (10 mg total) by mouth 3 (three) times daily before meals. 06/12/15   Lavonia Drafts, MD  FeFum-FePo-FA-B Cmp-C-Zn-Mn-Cu (SE-TAN PLUS) 937-169.6-7 MG CAPS TAKE 1 CAPSULE EVERY DAY 01/11/15   Abner Greenspan, MD  fluticasone [facility]) 50 MCG/ACT nasal spray Place 2 sprays into both nostrils daily. 06/20/15   Abner Greenspan, MD  glucose blood (FREESTYLE TEST STRIPS) test strip Use as instructed Patient not taking: Reported on 06/12/2015 04/09/14   Abner Greenspan, MD  glucose monitoring kit (FREESTYLE) monitoring kit 1 each by Does not apply route as needed for other. DX E11.9 Patient not taking: Reported on 06/12/2015 04/09/14   Abner Greenspan, MD  ipratropium (ATROVENT) 0.06 % nasal spray Place 2 sprays into both nostrils 4 (four) times daily. 07/01/15   Billy Fischer, MD  Lancets (FREESTYLE) lancets Use as instructed Patient not taking: Reported on 06/12/2015 04/09/14   Abner Greenspan, MD  levocetirizine [facility])  5 MG tablet Take 1 tablet (5 mg total) by mouth every evening. 12/05/14   Abner Greenspan, MD  montelukast (SINGULAIR) 10 MG tablet Take 1 tablet (10 mg total) by mouth daily. 12/05/14   Abner Greenspan, MD  Prenatal Vit-Fe Fumarate-FA (PRENATAL MULTIVITAMIN) TABS tablet Take 1 tablet by mouth daily at 12 noon.    Historical Provider, MD   Meds Ordered and Administered this Visit  Medications - No data to display  BP 182/98 mmHg  Pulse 88  Temp(Src) 98.7 F (37.1 C) (Oral)  Resp 18  SpO2 97%  LMP 06/23/2015 No data found.   Physical Exam  Constitutional: She is oriented to person, place, and time. She appears well-developed and well-nourished. No distress.  HENT:   Right Ear: External ear normal.  Left Ear: External ear normal.  Mouth/Throat: Oropharynx is clear and moist.  Eyes: Conjunctivae are normal. Pupils are equal, round, and reactive to light.  Neck: Normal range of motion. Neck supple.  Cardiovascular: Normal rate, regular rhythm, normal heart sounds and intact distal pulses.   Pulmonary/Chest: Effort normal and breath sounds normal.  Lymphadenopathy:    She has no cervical adenopathy.  Neurological: She is alert and oriented to person, place, and time.  Skin: Skin is warm and dry.  Nursing note and vitals reviewed.   ED Course  Procedures (including critical care time)  Labs Review Labs Reviewed  POCT RAPID STREP A   Strep neg.  Imaging Review No results found.   Visual Acuity Review  Right Eye Distance:   Left Eye Distance:   Bilateral Distance:    Right Eye Near:   Left Eye Near:    Bilateral Near:         MDM   1. Seasonal allergic rhinitis due to pollen        Billy Fischer, MD 07/01/15 1355

## 2015-07-01 NOTE — Discharge Instructions (Signed)
Drink plenty of fluids, avoid fans blowing directly on face, use medicine as needed, return if problems.

## 2015-07-03 LAB — CULTURE, GROUP A STREP (THRC)

## 2015-07-08 ENCOUNTER — Other Ambulatory Visit: Payer: Self-pay | Admitting: Family Medicine

## 2015-07-08 DIAGNOSIS — Z1231 Encounter for screening mammogram for malignant neoplasm of breast: Secondary | ICD-10-CM

## 2015-07-09 ENCOUNTER — Telehealth (HOSPITAL_COMMUNITY): Payer: Self-pay | Admitting: Emergency Medicine

## 2015-07-09 NOTE — ED Notes (Signed)
LM on pt's VM [phone] Need to give lab results from recent visit on 5/29 Also let pt know labs can be obtained from [facility]  Per Dr. Valere Dross,  Notes Recorded by Sherlene Shams, MD on 07/05/2015 at 8:52 AM Please let patient know that throat cx was positive for rare, non-group A strep. If still having severe sore throat/fever, could send rx for penicillin V 500mg  bid x 10d #20 no refill. Recheck as needed. LM

## 2015-07-15 ENCOUNTER — Ambulatory Visit
Admission: RE | Admit: 2015-07-15 | Discharge: 2015-07-15 | Disposition: A | Discharge status: Home / Self Care | Payer: BLUE CROSS/BLUE SHIELD | Source: Ambulatory Visit | Attending: Family Medicine | Admitting: Family Medicine

## 2015-07-15 DIAGNOSIS — Z1231 Encounter for screening mammogram for malignant neoplasm of breast: Secondary | ICD-10-CM

## 2015-07-15 LAB — HM MAMMOGRAPHY

## 2015-07-17 ENCOUNTER — Encounter: Payer: Self-pay | Admitting: *Deleted

## 2015-07-18 ENCOUNTER — Other Ambulatory Visit: Payer: BLUE CROSS/BLUE SHIELD

## 2015-07-22 ENCOUNTER — Other Ambulatory Visit (INDEPENDENT_AMBULATORY_CARE_PROVIDER_SITE_OTHER): Payer: BLUE CROSS/BLUE SHIELD

## 2015-07-22 DIAGNOSIS — E119 Type 2 diabetes mellitus without complications: Secondary | ICD-10-CM | POA: Diagnosis not present

## 2015-07-22 DIAGNOSIS — I10 Essential (primary) hypertension: Secondary | ICD-10-CM | POA: Diagnosis not present

## 2015-07-22 LAB — COMPREHENSIVE METABOLIC PANEL
ALT: 9 U/L (ref 0–35)
AST: 14 U/L (ref 0–37)
Albumin: 4.4 g/dL (ref 3.5–5.2)
Alkaline Phosphatase: 56 U/L (ref 39–117)
BUN: 17 mg/dL (ref 6–23)
CALCIUM: 9.3 mg/dL (ref 8.4–10.5)
CHLORIDE: 101 meq/L (ref 96–112)
CO2: 24 meq/L (ref 19–32)
CREATININE: 0.89 mg/dL (ref 0.40–1.20)
GFR: 87.65 mL/min (ref >60.00)
Glucose, Bld: 113 mg/dL — ABNORMAL HIGH (ref 70–99)
POTASSIUM: 4 meq/L (ref 3.5–5.1)
Sodium: 135 mEq/L (ref 135–145)
Total Bilirubin: 0.5 mg/dL (ref 0.2–1.2)
Total Protein: 8.1 g/dL (ref 6.0–8.3)

## 2015-07-22 LAB — LIPID PANEL
CHOLESTEROL: 210 mg/dL — AB (ref 0–200)
HDL: 67.4 mg/dL (ref >39.00)
LDL CALC: 132 mg/dL — AB (ref 0–99)
NonHDL: 142.83
TRIGLYCERIDES: 55 mg/dL (ref 0.0–149.0)
Total CHOL/HDL Ratio: 3
VLDL: 11 mg/dL (ref 0.0–40.0)

## 2015-07-22 LAB — HEMOGLOBIN A1C: Hgb A1c MFr Bld: 6 % (ref 4.6–6.5)

## 2015-07-23 ENCOUNTER — Ambulatory Visit: Payer: BLUE CROSS/BLUE SHIELD | Admitting: Family Medicine

## 2015-07-24 ENCOUNTER — Ambulatory Visit (INDEPENDENT_AMBULATORY_CARE_PROVIDER_SITE_OTHER): Payer: BLUE CROSS/BLUE SHIELD | Admitting: Family Medicine

## 2015-07-24 ENCOUNTER — Encounter: Payer: Self-pay | Admitting: Family Medicine

## 2015-07-24 ENCOUNTER — Ambulatory Visit: Payer: BLUE CROSS/BLUE SHIELD | Admitting: Obstetrics & Gynecology

## 2015-07-24 VITALS — BP 124/76 | HR 93 | Temp 98.6°F | Ht 63.0 in | Wt 151.4 lb

## 2015-07-24 DIAGNOSIS — E785 Hyperlipidemia, unspecified: Secondary | ICD-10-CM

## 2015-07-24 DIAGNOSIS — R739 Hyperglycemia, unspecified: Secondary | ICD-10-CM | POA: Diagnosis not present

## 2015-07-24 DIAGNOSIS — N946 Dysmenorrhea, unspecified: Secondary | ICD-10-CM

## 2015-07-24 DIAGNOSIS — I10 Essential (primary) hypertension: Secondary | ICD-10-CM

## 2015-07-24 MED ORDER — HYDROCHLOROTHIAZIDE 25 MG PO TABS: ORAL_TABLET | ORAL | Status: DC

## 2015-07-24 MED ORDER — EFLORNITHINE HCL 13.9 % EX CREA: TOPICAL_CREAM | CUTANEOUS | Status: DC

## 2015-07-24 NOTE — Patient Instructions (Signed)
Continue the HCTZ for your blood pressure  Try the Vaniqua for hair growth  Update me if any problems For cholesterol    (avoid red meat/ fried foods/ egg yolks/ fatty breakfast meats/ butter, cheese and high fat dairy/ and shellfish) Also blood sugar is improved- keep exercising   Follow up in about 6 months

## 2015-07-24 NOTE — Progress Notes (Signed)
Pre visit review using our clinic review tool, if applicable. No additional management support is needed unless otherwise documented below in the visit note. 

## 2015-07-24 NOTE — Progress Notes (Signed)
Subjective:    Patient ID: Ellen [name], female    DOB: March 16, 1970, 45 y.o.   MRN: [id number]  HPI Here for f/u of HTN and chronic health problems  Wt Readings from Last 3 Encounters:  07/24/15 151 lb 6.4 oz (68.675 kg)  06/12/15 147 lb (66.679 kg)  05/27/15 143 lb 12 oz (65.205 kg)  bmi of 26   bp is stable today  No cp or palpitations or headaches or edema  No side effects to medicines  BP Readings from Last 3 Encounters:  07/24/15 124/76  07/01/15 182/98  06/12/15 138/83    Last visit -thought she was having a rxn to hctz- she realized that was not the cause and re started it  She did take both amlodipine and hctz both on Monday and tues (by mistake)  This is Wednesday     Results for orders placed or performed in visit on 07/22/15  Comprehensive metabolic panel  Result Value Ref Range   Sodium 135 135 - 145 mEq/L   Potassium 4.0 3.5 - 5.1 mEq/L   Chloride 101 96 - 112 mEq/L   CO2 24 19 - 32 mEq/L   Glucose, Bld 113 (H) 70 - 99 mg/dL   BUN 17 6 - 23 mg/dL   Creatinine, Ser 0.89 0.40 - 1.20 mg/dL   Total Bilirubin 0.5 0.2 - 1.2 mg/dL   Alkaline Phosphatase 56 39 - 117 U/L   AST 14 0 - 37 U/L   ALT 9 0 - 35 U/L   Total Protein 8.1 6.0 - 8.3 g/dL   Albumin 4.4 3.5 - 5.2 g/dL   Calcium 9.3 8.4 - 10.5 mg/dL   GFR 87.65 >60.00 mL/min  Hemoglobin A1c  Result Value Ref Range   Hgb A1c MFr Bld 6.0 4.6 - 6.5 %  Lipid panel  Result Value Ref Range   Cholesterol 210 (H) 0 - 200 mg/dL   Triglycerides 55.0 0.0 - 149.0 mg/dL   HDL 67.40 >39.00 mg/dL   VLDL 11.0 0.0 - 40.0 mg/dL   LDL Cholesterol 132 (H) 0 - 99 mg/dL   Total CHOL/HDL Ratio 3    NonHDL 142.83     A1C is down from 6.5 to 6.0  She states she was eating bad for a month and then good for 2 mo  Much more exercise    Hx of hyperlipidemia Lab Results  Component Value Date   CHOL 210* 07/22/2015   CHOL 191 12/04/2014   CHOL 203* 03/21/2014   Lab Results  Component Value Date   HDL 67.40  07/22/2015   HDL 66.20 12/04/2014   HDL 57.00 03/21/2014   Lab Results  Component Value Date   LDLCALC 132* 07/22/2015   LDLCALC 113* 12/04/2014   LDLCALC 131* 03/21/2014   Lab Results  Component Value Date   TRIG 55.0 07/22/2015   TRIG 63.0 12/04/2014   TRIG 73.0 03/21/2014   Lab Results  Component Value Date   CHOLHDL 3 07/22/2015   CHOLHDL 3 12/04/2014   CHOLHDL 4 03/21/2014   Lab Results  Component Value Date   LDLDIRECT 125.4 03/18/2010   has eaten more fast food and fried food for 1 month  Will get back to the better eating - just started   More hair growth on chin as she ages-also wants to try vaniqa   Did see gyn  Given dicyclomine 10 mg for ibs symptoms during her period  Today is the first day she tried it -doing  ok overall  Is not making her sleepy   Patient Active Problem List   Diagnosis Date Noted  . Fibroids 05/27/2015  . Menses painful 05/27/2015  . Irregular menses 12/04/2014  . Essential hypertension 07/16/2014  . Routine general medical examination at a health care facility 03/20/2014  . Anemia, iron deficiency 05/04/2012  . Elevated blood pressure (not hypertension) 05/04/2012  . Seborrheic dermatitis 09/25/2011  . GANGLION CYST, WRIST, LEFT 03/03/2010  . COMMON MIGRAINE 06/22/2008  . Hyperglycemia 02/15/2008  . DIABETES MELLITUS, GESTATIONAL, HX OF 02/15/2008  . Hyperlipidemia 10/01/2006  . Sinusitis, chronic 10/01/2006  . ALLERGIC RHINITIS 10/01/2006  . ALOPECIA 10/01/2006   Past Medical History  Diagnosis Date  . Allergic rhinitis due to pollen   . Hyperlipidemia   . DM (diabetes mellitus), gestational   . 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 Migraine   . Seborrheic dermatitis   . Hypertension    Past Surgical History  Procedure Laterality Date  . Pilonidal cyst excision    . Uterine fibroid surgery    . Hystosalpingogram    . Cesarean section      x 2    Social History  Substance Use Topics  . Smoking  status: Never Smoker   . Smokeless tobacco: Never Used  . Alcohol Use: No   Family History  Problem Relation Age of Onset  . Liver cancer Mother   . Diabetes Father   . Heart disease Father   . Migraines Sister   . Migraines Sister   . Migraines Sister   . Colon cancer Neg Hx    No Active Allergies Current Outpatient Prescriptions on File Prior to Visit  Medication Sig Dispense Refill  . Blood Glucose Monitoring Suppl (FREESTYLE LITE) DEVI Reported on 06/12/2015  0  . cetirizine (ZYRTEC) 10 MG tablet Take 1 tablet (10 mg total) by mouth daily. One tab daily for allergies 30 tablet 1  . dicyclomine (BENTYL) 10 MG capsule Take 1 capsule (10 mg total) by mouth 3 (three) times daily before meals. 16 capsule 3  . FeFum-FePo-FA-B Cmp-C-Zn-Mn-Cu (SE-TAN PLUS) 162-115.2-1 MG CAPS TAKE 1 CAPSULE EVERY DAY 30 capsule 11  . fluticasone (FLONASE) 50 MCG/ACT nasal spray Place 2 sprays into both nostrils daily. 16 g 11  . glucose blood (FREESTYLE TEST STRIPS) test strip Use as instructed 100 each 12  . glucose monitoring kit (FREESTYLE) monitoring kit 1 each by Does not apply route as needed for other. DX E11.9 1 each 0  . ipratropium (ATROVENT) 0.06 % nasal spray Place 2 sprays into both nostrils 4 (four) times daily. 15 mL 1  . Lancets (FREESTYLE) lancets Use as instructed 100 each 12  . levocetirizine (XYZAL) 5 MG tablet Take 1 tablet (5 mg total) by mouth every evening. 90 tablet 3  . montelukast (SINGULAIR) 10 MG tablet Take 1 tablet (10 mg total) by mouth daily. 90 tablet 3  . Prenatal Vit-Fe Fumarate-FA (PRENATAL MULTIVITAMIN) TABS tablet Take 1 tablet by mouth daily at 12 noon.     No current facility-administered medications on file prior to visit.    Review of Systems Review of Systems  Constitutional: Negative for fever, appetite change, fatigue and unexpected weight change.  Eyes: Negative for pain and visual disturbance.  Respiratory: Negative for cough and shortness of breath.     Cardiovascular: Negative for cp or palpitations    Gastrointestinal: Negative for nausea, diarrhea and constipation. pos for abd cramping during menses Genitourinary: Negative  for urgency and frequency.  Skin: Negative for pallor or rash   Neurological: Negative for weakness, light-headedness, numbness and headaches.  Hematological: Negative for adenopathy. Does not bruise/bleed easily.  Psychiatric/Behavioral: Negative for dysphoric mood. The patient is not nervous/anxious.         Objective:   Physical Exam  Constitutional: She appears well-developed and well-nourished. No distress.  Well appearing   HENT:  Head: Normocephalic and atraumatic.  Mouth/Throat: Oropharynx is clear and moist.  Eyes: Conjunctivae and EOM are normal. Pupils are equal, round, and reactive to light.  Neck: Normal range of motion. Neck supple. No JVD present. Carotid bruit is not present. No thyromegaly present.  Cardiovascular: Normal rate, regular rhythm, normal heart sounds and intact distal pulses.  Exam reveals no gallop.   Pulmonary/Chest: Effort normal and breath sounds normal. No respiratory distress. She has no wheezes. She has no rales.  No crackles  Abdominal: Soft. Bowel sounds are normal. She exhibits no distension, no abdominal bruit and no mass. There is no tenderness.  Musculoskeletal: She exhibits no edema.  Lymphadenopathy:    She has no cervical adenopathy.  Neurological: She is alert. She has normal reflexes.  Skin: Skin is warm and dry. No rash noted. No pallor.  Psychiatric: She has a normal mood and affect.          Assessment & Plan:   Problem List Items Addressed This Visit      Cardiovascular and Mediastinum   Essential hypertension - Primary    bp in fair control at this time  BP Readings from Last 1 Encounters:  07/24/15 124/76   No changes needed Disc lifstyle change with low sodium diet and exercise  Will stay on hctz and not amlodipine Labs reviewed  F/u 6 mo         Relevant Medications   hydrochlorothiazide (HYDRODIURIL) 25 MG tablet     Genitourinary   Menses painful    For GI symptoms (IBS) during menses-her gyn px dicyclomine Will see how this works         Other   Hyperlipidemia    Disc goals for lipids and reasons to control them Rev labs with pt Rev low sat fat diet in detail LDL up a bit -she will work on diet for this       Relevant Medications   hydrochlorothiazide (HYDRODIURIL) 25 MG tablet   Hyperglycemia    Hyperglycemia- A1C is down to 6.0 now -reassuring  Disc wt control/ low glycemic diet and exercise to keep this out of the DM range

## 2015-07-25 NOTE — Assessment & Plan Note (Signed)
bp in fair control at this time  BP Readings from Last 1 Encounters:  07/24/15 124/76   No changes needed Disc lifstyle change with low sodium diet and exercise  Will stay on hctz and not amlodipine Labs reviewed  F/u 6 mo

## 2015-07-25 NOTE — Assessment & Plan Note (Signed)
For GI symptoms (IBS) during menses-her gyn px dicyclomine Will see how this works

## 2015-07-25 NOTE — Assessment & Plan Note (Signed)
Hyperglycemia- A1C is down to 6.0 now -reassuring  Disc wt control/ low glycemic diet and exercise to keep this out of the DM range

## 2015-07-25 NOTE — Assessment & Plan Note (Signed)
Disc goals for lipids and reasons to control them Rev labs with pt Rev low sat fat diet in detail LDL up a bit -she will work on diet for this

## 2015-08-08 ENCOUNTER — Encounter: Payer: Self-pay | Admitting: Obstetrics & Gynecology

## 2015-08-08 ENCOUNTER — Ambulatory Visit (INDEPENDENT_AMBULATORY_CARE_PROVIDER_SITE_OTHER): Payer: BLUE CROSS/BLUE SHIELD | Admitting: Obstetrics & Gynecology

## 2015-08-08 VITALS — BP 160/107 | HR 80 | Resp 16 | Ht 64.0 in | Wt 152.0 lb

## 2015-08-08 DIAGNOSIS — Z01419 Encounter for gynecological examination (general) (routine) without abnormal findings: Secondary | ICD-10-CM | POA: Diagnosis not present

## 2015-08-08 DIAGNOSIS — Z1151 Encounter for screening for human papillomavirus (HPV): Secondary | ICD-10-CM | POA: Diagnosis not present

## 2015-08-08 DIAGNOSIS — Z124 Encounter for screening for malignant neoplasm of cervix: Secondary | ICD-10-CM | POA: Diagnosis not present

## 2015-08-08 NOTE — Progress Notes (Signed)
GYNECOLOGY CLINIC ANNUAL PREVENTATIVE CARE ENCOUNTER NOTE  Subjective:   Ellen [name] is a 45 y.o. G62P2002 female here for a routine annual gynecologic exam.  Current complaints: none.   Denies abnormal vaginal bleeding, discharge, pelvic pain, problems with intercourse or other gynecologic concerns.    Gynecologic History Patient's last menstrual period was 07/29/2015. Contraception: none Last Pap: 06/23/12. Results were: normal but absent endocervical component Last mammogram: 07/16/15. Results were: normal  Obstetric History OB History  Gravida Para Term Preterm AB SAB TAB Ectopic Multiple Living  '2 2 2       2    '$ # Outcome Date GA Lbr Len/2nd Weight Sex Delivery Anes PTL Lv  2 Term 09/15/07 [redacted]w[redacted]d 7 lb 13 oz (3.544 kg) M CS-LTranv   Y     Complications: GDM (gestational diabetes mellitus)  1 Term 08/21/05 [id number] lb 11 oz (3.487 kg) M CS-LTranv   Y     Complications: Failure to Progress in First Stage,GDM (gestational diabetes mellitus)      Past Medical History  Diagnosis Date  . Allergic rhinitis due to pollen   . Hyperlipidemia   . DM (diabetes mellitus), gestational   . 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 Migraine   . Seborrheic dermatitis   . Hypertension     Past Surgical History  Procedure Laterality Date  . Pilonidal cyst excision    . Uterine fibroid surgery    . Hystosalpingogram    . Cesarean section      x 2     Current Outpatient Prescriptions on File Prior to Visit  Medication Sig Dispense Refill  . Blood Glucose Monitoring Suppl (FREESTYLE LITE) DEVI Reported on 06/12/2015  0  . dicyclomine (BENTYL) 10 MG capsule Take 1 capsule (10 mg total) by mouth 3 (three) times daily before meals. 16 capsule 3  . fluticasone (FLONASE) 50 MCG/ACT nasal spray Place 2 sprays into both nostrils daily. 16 g 11  . glucose blood (FREESTYLE TEST STRIPS) test strip Use as instructed 100 each 12  . glucose monitoring kit (FREESTYLE) monitoring  kit 1 each by Does not apply route as needed for other. DX E11.9 1 each 0  . hydrochlorothiazide (HYDRODIURIL) 25 MG tablet TAKE 1 TABLET (25 MG TOTAL) BY MOUTH DAILY. 30 tablet 11  . ipratropium (ATROVENT) 0.06 % nasal spray Place 2 sprays into both nostrils 4 (four) times daily. 15 mL 1  . Lancets (FREESTYLE) lancets Use as instructed 100 each 12  . levocetirizine (XYZAL) 5 MG tablet Take 1 tablet (5 mg total) by mouth every evening. 90 tablet 3  . montelukast (SINGULAIR) 10 MG tablet Take 1 tablet (10 mg total) by mouth daily. 90 tablet 3  . Prenatal Vit-Fe Fumarate-FA (PRENATAL MULTIVITAMIN) TABS tablet Take 1 tablet by mouth daily at 12 noon.     No current facility-administered medications on file prior to visit.    No Known Allergies  Social History   Social History  . Marital Status: Married    Spouse Name: N/A  . Number of Children: 2  . Years of Education: N/A   Occupational History  . Account manager    Social History Main Topics  . Smoking status: Never Smoker   . Smokeless tobacco: Never Used  . Alcohol Use: No  . Drug Use: No  . Sexual Activity: Yes    Birth Control/ Protection: None   Other Topics Concern  . Not on  file   Social History Narrative    Family History  Problem Relation Age of Onset  . Liver cancer Mother   . Diabetes Father   . Heart disease Father   . Migraines Sister   . Migraines Sister   . Migraines Sister   . Colon cancer Neg Hx     The following portions of the patient's history were reviewed and updated as appropriate: allergies, current medications, past family history, past medical history, past social history, past surgical history and problem list.  Review of Systems Pertinent items noted in HPI and remainder of comprehensive ROS otherwise negative.   Objective:  BP 160/107 mmHg  Pulse 80  Resp 16  Ht '5\' 4"'$  (1.626 m)  Wt 152 lb (68.947 kg)  BMI 26.08 kg/m2  LMP 07/29/2015 CONSTITUTIONAL: Well-developed,  well-nourished female in no acute distress.  HENT:  Normocephalic, atraumatic, External right and left ear normal. Oropharynx is clear and moist EYES: Conjunctivae and EOM are normal. Pupils are equal, round, and reactive to light. No scleral icterus.  NECK: Normal range of motion, supple, no masses.  Normal thyroid.  SKIN: Skin is warm and dry. No rash noted. Not diaphoretic. No erythema. No pallor. NEUROLOGIC: Alert and oriented to person, place, and time. Normal reflexes, muscle tone coordination. No cranial nerve deficit noted. PSYCHIATRIC: Normal mood and affect. Normal behavior. Normal judgment and thought content. CARDIOVASCULAR: Normal heart rate noted, regular rhythm RESPIRATORY: Clear to auscultation bilaterally. Effort and breath sounds normal, no problems with respiration noted. BREASTS: Symmetric in size. No masses, skin changes, nipple drainage, or lymphadenopathy. ABDOMEN: Soft, normal bowel sounds, no distention noted.  No tenderness, rebound or guarding.  PELVIC: Normal appearing external genitalia; normal appearing vaginal mucosa and cervix.  No abnormal discharge noted.  Pap smear obtained.  Enlarged fibroid uterus, no other palpable masses, no uterine or adnexal tenderness. MUSCULOSKELETAL: Normal range of motion. No tenderness.  No cyanosis, clubbing, or edema.  2+ distal pulses.   Assessment:  Annual gynecologic examination with pap smear   Plan:  Will follow up results of pap smear and manage accordingly. Mammogram is uptodate Routine preventative health maintenance measures emphasized. Please refer to After Visit Summary for other counseling recommendations.    Verita Schneiders, MD, [place] Attending Obstetrician & Gynecologist, [place] for [facility]

## 2015-08-08 NOTE — Patient Instructions (Addendum)
Thank you for enrolling in [facility]. Please follow the instructions below to securely access your online medical record. MyChart allows you to send messages to your doctor, view your test results, manage appointments, and more.   How Do I Sign Up? 1. In your Internet browser, go to [employer] and enter [email or web address]. 2. Click on the Sign Up Now link in the Sign In box. You will see the New Member Sign Up page. 3. Enter your MyChart Access Code exactly as it appears below. You will not need to use this code after you've completed the sign-up process. If you do not sign up before the expiration date, you must request a new code.  MyChart Access Code: [email or web address] Expires: 09/21/2015 11:49 AM  4. Enter your Social Security Number ([id number]) and Date of Birth (mm/dd/yyyy) as indicated and click Submit. You will be taken to the next sign-up page. 5. Create a MyChart ID. This will be your MyChart login ID and cannot be changed, so think of one that is secure and easy to remember. 6. Create a MyChart password. You can change your password at any time. 7. Enter your Password Reset Question and Answer. This can be used at a later time if you forget your password.  8. Enter your e-mail address. You will receive e-mail notification when new information is available in [facility]. 9. Click Sign Up. You can now view your medical record.   Additional Information Remember, MyChart is NOT to be used for urgent needs. For medical emergencies, dial 911.     Preventive Care for Adults, Female A healthy lifestyle and preventive care can promote health and wellness. Preventive health guidelines for women include the following key practices.  A routine yearly physical is a good way to check with your health care provider about your health and preventive screening. It is a chance to share any concerns and updates on your health and to receive a thorough exam.  Visit your dentist for a  routine exam and preventive care every 6 months. Brush your teeth twice a day and floss once a day. Good oral hygiene prevents tooth decay and gum disease.  The frequency of eye exams is based on your age, health, family medical history, use of contact lenses, and other factors. Follow your health care provider's recommendations for frequency of eye exams.  Eat a healthy diet. Foods like vegetables, fruits, whole grains, low-fat dairy products, and lean protein foods contain the nutrients you need without too many calories. Decrease your intake of foods high in solid fats, added sugars, and salt. Eat the right amount of calories for you.Get information about a proper diet from your health care provider, if necessary.  Regular physical exercise is one of the most important things you can do for your health. Most adults should get at least 150 minutes of moderate-intensity exercise (any activity that increases your heart rate and causes you to sweat) each week. In addition, most adults need muscle-strengthening exercises on 2 or more days a week.  Maintain a healthy weight. The body mass index (BMI) is a screening tool to identify possible weight problems. It provides an estimate of body fat based on height and weight. Your health care provider can find your BMI and can help you achieve or maintain a healthy weight.For adults 20 years and older:  A BMI below 18.5 is considered underweight.  A BMI of 18.5 to 24.9 is normal.  A BMI of 25 to 29.9 is considered overweight.  A BMI of 30 and above is considered obese.  Maintain normal blood lipids and cholesterol levels by exercising and minimizing your intake of saturated fat. Eat a balanced diet with plenty of fruit and vegetables. Blood tests for lipids and cholesterol should begin at age 55 and be repeated every 5 years. If your lipid or cholesterol levels are high, you are over 50, or you are at high risk for heart disease, you may need your  cholesterol levels checked more frequently.Ongoing high lipid and cholesterol levels should be treated with medicines if diet and exercise are not working.  If you smoke, find out from your health care provider how to quit. If you do not use tobacco, do not start.  Lung cancer screening is recommended for adults aged 12-80 years who are at high risk for developing lung cancer because of a history of smoking. A yearly low-dose CT scan of the lungs is recommended for people who have at least a 30-pack-year history of smoking and are a current smoker or have quit within the past 15 years. A pack year of smoking is smoking an average of 1 pack of cigarettes a day for 1 year (for example: 1 pack a day for 30 years or 2 packs a day for 15 years). Yearly screening should continue until the smoker has stopped smoking for at least 15 years. Yearly screening should be stopped for people who develop a health problem that would prevent them from having lung cancer treatment.  If you are pregnant, do not drink alcohol. If you are breastfeeding, be very cautious about drinking alcohol. If you are not pregnant and choose to drink alcohol, do not have more than 1 drink per day. One drink is considered to be 12 ounces (355 mL) of beer, 5 ounces (148 mL) of wine, or 1.5 ounces (44 mL) of liquor.  Avoid use of street drugs. Do not share needles with anyone. Ask for help if you need support or instructions about stopping the use of drugs.  High blood pressure causes heart disease and increases the risk of stroke. Your blood pressure should be checked at least every 1 to 2 years. Ongoing high blood pressure should be treated with medicines if weight loss and exercise do not work.  If you are 11-68 years old, ask your health care provider if you should take aspirin to prevent strokes.  Diabetes screening is done by taking a blood sample to check your blood glucose level after you have not eaten for a certain period of time  (fasting). If you are not overweight and you do not have risk factors for diabetes, you should be screened once every 3 years starting at age 81. If you are overweight or obese and you are 37-86 years of age, you should be screened for diabetes every year as part of your cardiovascular risk assessment.  Breast cancer screening is essential preventive care for women. You should practice "breast self-awareness." This means understanding the normal appearance and feel of your breasts and may include breast self-examination. Any changes detected, no matter how small, should be reported to a health care provider. Women in their 85s and 30s should have a clinical breast exam (CBE) by a health care provider as part of a regular health exam every 1 to 3 years. After age 22, women should have a CBE every year. Starting at age 65, women should consider having a mammogram (breast X-ray test) every year. Women who have a family history of  breast cancer should talk to their health care provider about genetic screening. Women at a high risk of breast cancer should talk to their health care providers about having an MRI and a mammogram every year.  Breast cancer gene (BRCA)-related cancer risk assessment is recommended for women who have family members with BRCA-related cancers. BRCA-related cancers include breast, ovarian, tubal, and peritoneal cancers. Having family members with these cancers may be associated with an increased risk for harmful changes (mutations) in the breast cancer genes BRCA1 and BRCA2. Results of the assessment will determine the need for genetic counseling and BRCA1 and BRCA2 testing.  Your health care provider may recommend that you be screened regularly for cancer of the pelvic organs (ovaries, uterus, and vagina). This screening involves a pelvic examination, including checking for microscopic changes to the surface of your cervix (Pap test). You may be encouraged to have this screening done every  3 years, beginning at age 52.  For women ages 57-65, health care providers may recommend pelvic exams and Pap testing every 3 years, or they may recommend the Pap and pelvic exam, combined with testing for human papilloma virus (HPV), every 5 years. Some types of HPV increase your risk of cervical cancer. Testing for HPV may also be done on women of any age with unclear Pap test results.  Other health care providers may not recommend any screening for nonpregnant women who are considered low risk for pelvic cancer and who do not have symptoms. Ask your health care provider if a screening pelvic exam is right for you.  If you have had past treatment for cervical cancer or a condition that could lead to cancer, you need Pap tests and screening for cancer for at least 20 years after your treatment. If Pap tests have been discontinued, your risk factors (such as having a new sexual partner) need to be reassessed to determine if screening should resume. Some women have medical problems that increase the chance of getting cervical cancer. In these cases, your health care provider may recommend more frequent screening and Pap tests.  Colorectal cancer can be detected and often prevented. Most routine colorectal cancer screening begins at the age of 43 years and continues through age 49 years. However, your health care provider may recommend screening at an earlier age if you have risk factors for colon cancer. On a yearly basis, your health care provider may provide home test kits to check for hidden blood in the stool. Use of a small camera at the end of a tube, to directly examine the colon (sigmoidoscopy or colonoscopy), can detect the earliest forms of colorectal cancer. Talk to your health care provider about this at age 66, when routine screening begins. Direct exam of the colon should be repeated every 5-10 years through age 66 years, unless early forms of precancerous polyps or small growths are  found.  People who are at an increased risk for hepatitis B should be screened for this virus. You are considered at high risk for hepatitis B if:  You were born in a country where hepatitis B occurs often. Talk with your health care provider about which countries are considered high risk.  Your parents were born in a high-risk country and you have not received a shot to protect against hepatitis B (hepatitis B vaccine).  You have HIV or AIDS.  You use needles to inject street drugs.  You live with, or have sex with, someone who has hepatitis B.  You get  hemodialysis treatment.  You take certain medicines for conditions like cancer, organ transplantation, and autoimmune conditions.  Hepatitis C blood testing is recommended for all people born from [birth year] through 1966 and any individual with known risks for hepatitis C.  Practice safe sex. Use condoms and avoid high-risk sexual practices to reduce the spread of sexually transmitted infections (STIs). STIs include gonorrhea, chlamydia, syphilis, trichomonas, herpes, HPV, and human immunodeficiency virus (HIV). Herpes, HIV, and HPV are viral illnesses that have no cure. They can result in disability, cancer, and death.  You should be screened for sexually transmitted illnesses (STIs) including gonorrhea and chlamydia if:  You are sexually active and are younger than 24 years.  You are older than 24 years and your health care provider tells you that you are at risk for this type of infection.  Your sexual activity has changed since you were last screened and you are at an increased risk for chlamydia or gonorrhea. Ask your health care provider if you are at risk.  If you are at risk of being infected with HIV, it is recommended that you take a prescription medicine daily to prevent HIV infection. This is called preexposure prophylaxis (PrEP). You are considered at risk if:  You are sexually active and do not regularly use condoms or know  the HIV status of your partner(s).  You take drugs by injection.  You are sexually active with a partner who has HIV.  Talk with your health care provider about whether you are at high risk of being infected with HIV. If you choose to begin PrEP, you should first be tested for HIV. You should then be tested every 3 months for as long as you are taking PrEP.  Osteoporosis is a disease in which the bones lose minerals and strength with aging. This can result in serious bone fractures or breaks. The risk of osteoporosis can be identified using a bone density scan. Women ages 21 years and over and women at risk for fractures or osteoporosis should discuss screening with their health care providers. Ask your health care provider whether you should take a calcium supplement or vitamin D to reduce the rate of osteoporosis.  Menopause can be associated with physical symptoms and risks. Hormone replacement therapy is available to decrease symptoms and risks. You should talk to your health care provider about whether hormone replacement therapy is right for you.  Use sunscreen. Apply sunscreen liberally and repeatedly throughout the day. You should seek shade when your shadow is shorter than you. Protect yourself by wearing long sleeves, pants, a wide-brimmed hat, and sunglasses year round, whenever you are outdoors.  Once a month, do a whole body skin exam, using a mirror to look at the skin on your back. Tell your health care provider of new moles, moles that have irregular borders, moles that are larger than a pencil eraser, or moles that have changed in shape or color.  Stay current with required vaccines (immunizations).  Influenza vaccine. All adults should be immunized every year.  Tetanus, diphtheria, and acellular pertussis (Td, Tdap) vaccine. Pregnant women should receive 1 dose of Tdap vaccine during each pregnancy. The dose should be obtained regardless of the length of time since the last  dose. Immunization is preferred during the 27th-36th week of gestation. An adult who has not previously received Tdap or who does not know her vaccine status should receive 1 dose of Tdap. This initial dose should be followed by tetanus and diphtheria toxoids (Td)  booster doses every 10 years. Adults with an unknown or incomplete history of completing a 3-dose immunization series with Td-containing vaccines should begin or complete a primary immunization series including a Tdap dose. Adults should receive a Td booster every 10 years.  Varicella vaccine. An adult without evidence of immunity to varicella should receive 2 doses or a second dose if she has previously received 1 dose. Pregnant females who do not have evidence of immunity should receive the first dose after pregnancy. This first dose should be obtained before leaving the health care facility. The second dose should be obtained 4-8 weeks after the first dose.  Human papillomavirus (HPV) vaccine. Females aged 13-26 years who have not received the vaccine previously should obtain the 3-dose series. The vaccine is not recommended for use in pregnant females. However, pregnancy testing is not needed before receiving a dose. If a female is found to be pregnant after receiving a dose, no treatment is needed. In that case, the remaining doses should be delayed until after the pregnancy. Immunization is recommended for any person with an immunocompromised condition through the age of 69 years if she did not get any or all doses earlier. During the 3-dose series, the second dose should be obtained 4-8 weeks after the first dose. The third dose should be obtained 24 weeks after the first dose and 16 weeks after the second dose.  Zoster vaccine. One dose is recommended for adults aged 50 years or older unless certain conditions are present.  Measles, mumps, and rubella (MMR) vaccine. Adults born before [birth year] generally are considered immune to measles and  mumps. Adults born in [birth year] or later should have 1 or more doses of MMR vaccine unless there is a contraindication to the vaccine or there is laboratory evidence of immunity to each of the three diseases. A routine second dose of MMR vaccine should be obtained at least 28 days after the first dose for students attending postsecondary schools, health care workers, or international travelers. People who received inactivated measles vaccine or an unknown type of measles vaccine during 1963-1967 should receive 2 doses of MMR vaccine. People who received inactivated mumps vaccine or an unknown type of mumps vaccine before 1979 and are at high risk for mumps infection should consider immunization with 2 doses of MMR vaccine. For females of childbearing age, rubella immunity should be determined. If there is no evidence of immunity, females who are not pregnant should be vaccinated. If there is no evidence of immunity, females who are pregnant should delay immunization until after pregnancy. Unvaccinated health care workers born before [birth year] who lack laboratory evidence of measles, mumps, or rubella immunity or laboratory confirmation of disease should consider measles and mumps immunization with 2 doses of MMR vaccine or rubella immunization with 1 dose of MMR vaccine.  Pneumococcal 13-valent conjugate (PCV13) vaccine. When indicated, a person who is uncertain of his immunization history and has no record of immunization should receive the PCV13 vaccine. All adults 5 years of age and older should receive this vaccine. An adult aged 87 years or older who has certain medical conditions and has not been previously immunized should receive 1 dose of PCV13 vaccine. This PCV13 should be followed with a dose of pneumococcal polysaccharide (PPSV23) vaccine. Adults who are at high risk for pneumococcal disease should obtain the PPSV23 vaccine at least 8 weeks after the dose of PCV13 vaccine. Adults older than 46 years of age who  have normal immune system function should obtain  the PPSV23 vaccine dose at least 1 year after the dose of PCV13 vaccine.  Pneumococcal polysaccharide (PPSV23) vaccine. When PCV13 is also indicated, PCV13 should be obtained first. All adults aged 31 years and older should be immunized. An adult younger than age 42 years who has certain medical conditions should be immunized. Any person who resides in a nursing home or long-term care facility should be immunized. An adult smoker should be immunized. People with an immunocompromised condition and certain other conditions should receive both PCV13 and PPSV23 vaccines. People with human immunodeficiency virus (HIV) infection should be immunized as soon as possible after diagnosis. Immunization during chemotherapy or radiation therapy should be avoided. Routine use of PPSV23 vaccine is not recommended for American Indians, [place] Natives, or people younger than 65 years unless there are medical conditions that require PPSV23 vaccine. When indicated, people who have unknown immunization and have no record of immunization should receive PPSV23 vaccine. One-time revaccination 5 years after the first dose of PPSV23 is recommended for people aged 19-64 years who have chronic kidney failure, nephrotic syndrome, asplenia, or immunocompromised conditions. People who received 1-2 doses of PPSV23 before age 63 years should receive another dose of PPSV23 vaccine at age 68 years or later if at least 5 years have passed since the previous dose. Doses of PPSV23 are not needed for people immunized with PPSV23 at or after age 79 years.  Meningococcal vaccine. Adults with asplenia or persistent complement component deficiencies should receive 2 doses of quadrivalent meningococcal conjugate (MenACWY-D) vaccine. The doses should be obtained at least 2 months apart. Microbiologists working with certain meningococcal bacteria, [employer] recruits, people at risk during an outbreak, and  people who travel to or live in countries with a high rate of meningitis should be immunized. A first-year college student up through age 67 years who is living in a residence hall should receive a dose if she did not receive a dose on or after her 16th birthday. Adults who have certain high-risk conditions should receive one or more doses of vaccine.  Hepatitis A vaccine. Adults who wish to be protected from this disease, have certain high-risk conditions, work with hepatitis A-infected animals, work in hepatitis A research labs, or travel to or work in countries with a high rate of hepatitis A should be immunized. Adults who were previously unvaccinated and who anticipate close contact with an international adoptee during the first 60 days after arrival in the [place] from a country with a high rate of hepatitis A should be immunized.  Hepatitis B vaccine. Adults who wish to be protected from this disease, have certain high-risk conditions, may be exposed to blood or other infectious body fluids, are household contacts or sex partners of hepatitis B positive people, are clients or workers in certain care facilities, or travel to or work in countries with a high rate of hepatitis B should be immunized.  Haemophilus influenzae type b (Hib) vaccine. A previously unvaccinated person with asplenia or sickle cell disease or having a scheduled splenectomy should receive 1 dose of Hib vaccine. Regardless of previous immunization, a recipient of a hematopoietic stem cell transplant should receive a 3-dose series 6-12 months after her successful transplant. Hib vaccine is not recommended for adults with HIV infection. Preventive Services / Frequency Ages 68 to 68 years  Blood pressure check.** / Every 3-5 years.  Lipid and cholesterol check.** / Every 5 years beginning at age 53.  Clinical breast exam.** / Every 3 years for women  in their 64s and 30s.  BRCA-related cancer risk assessment.** / For women  who have family members with a BRCA-related cancer (breast, ovarian, tubal, or peritoneal cancers).  Pap test.** / Every 2 years from ages 52 through 14. Every 3 years starting at age 40 through age 97 or 32 with a history of 3 consecutive normal Pap tests.  HPV screening.** / Every 3 years from ages 90 through ages 42 to 38 with a history of 3 consecutive normal Pap tests.  Hepatitis C blood test.** / For any individual with known risks for hepatitis C.  Skin self-exam. / Monthly.  Influenza vaccine. / Every year.  Tetanus, diphtheria, and acellular pertussis (Tdap, Td) vaccine.** / Consult your health care provider. Pregnant women should receive 1 dose of Tdap vaccine during each pregnancy. 1 dose of Td every 10 years.  Varicella vaccine.** / Consult your health care provider. Pregnant females who do not have evidence of immunity should receive the first dose after pregnancy.  HPV vaccine. / 3 doses over 6 months, if 23 and younger. The vaccine is not recommended for use in pregnant females. However, pregnancy testing is not needed before receiving a dose.  Measles, mumps, rubella (MMR) vaccine.** / You need at least 1 dose of MMR if you were born in 1957 or later. You may also need a 2nd dose. For females of childbearing age, rubella immunity should be determined. If there is no evidence of immunity, females who are not pregnant should be vaccinated. If there is no evidence of immunity, females who are pregnant should delay immunization until after pregnancy.  Pneumococcal 13-valent conjugate (PCV13) vaccine.** / Consult your health care provider.  Pneumococcal polysaccharide (PPSV23) vaccine.** / 1 to 2 doses if you smoke cigarettes or if you have certain conditions.  Meningococcal vaccine.** / 1 dose if you are age 23 to 58 years and a Market researcher living in a residence hall, or have one of several medical conditions, you need to get vaccinated against meningococcal  disease. You may also need additional booster doses.  Hepatitis A vaccine.** / Consult your health care provider.  Hepatitis B vaccine.** / Consult your health care provider.  Haemophilus influenzae type b (Hib) vaccine.** / Consult your health care provider. Ages 4 to 82 years  Blood pressure check.** / Every year.  Lipid and cholesterol check.** / Every 5 years beginning at age 64 years.  Lung cancer screening. / Every year if you are aged 64-80 years and have a 30-pack-year history of smoking and currently smoke or have quit within the past 15 years. Yearly screening is stopped once you have quit smoking for at least 15 years or develop a health problem that would prevent you from having lung cancer treatment.  Clinical breast exam.** / Every year after age 78 years.  BRCA-related cancer risk assessment.** / For women who have family members with a BRCA-related cancer (breast, ovarian, tubal, or peritoneal cancers).  Mammogram.** / Every year beginning at age 38 years and continuing for as long as you are in good health. Consult with your health care provider.  Pap test.** / Every 3 years starting at age 61 years through age 58 or 37 years with a history of 3 consecutive normal Pap tests.  HPV screening.** / Every 3 years from ages 56 years through ages 6 to 1 years with a history of 3 consecutive normal Pap tests.  Fecal occult blood test (FOBT) of stool. / Every year beginning at age 54  years and continuing until age 42 years. You may not need to do this test if you get a colonoscopy every 10 years.  Flexible sigmoidoscopy or colonoscopy.** / Every 5 years for a flexible sigmoidoscopy or every 10 years for a colonoscopy beginning at age 39 years and continuing until age 32 years.  Hepatitis C blood test.** / For all people born from [birth year] through 1966 and any individual with known risks for hepatitis C.  Skin self-exam. / Monthly.  Influenza vaccine. / Every year.  Tetanus,  diphtheria, and acellular pertussis (Tdap/Td) vaccine.** / Consult your health care provider. Pregnant women should receive 1 dose of Tdap vaccine during each pregnancy. 1 dose of Td every 10 years.  Varicella vaccine.** / Consult your health care provider. Pregnant females who do not have evidence of immunity should receive the first dose after pregnancy.  Zoster vaccine.** / 1 dose for adults aged 21 years or older.  Measles, mumps, rubella (MMR) vaccine.** / You need at least 1 dose of MMR if you were born in 1957 or later. You may also need a second dose. For females of childbearing age, rubella immunity should be determined. If there is no evidence of immunity, females who are not pregnant should be vaccinated. If there is no evidence of immunity, females who are pregnant should delay immunization until after pregnancy.  Pneumococcal 13-valent conjugate (PCV13) vaccine.** / Consult your health care provider.  Pneumococcal polysaccharide (PPSV23) vaccine.** / 1 to 2 doses if you smoke cigarettes or if you have certain conditions.  Meningococcal vaccine.** / Consult your health care provider.  Hepatitis A vaccine.** / Consult your health care provider.  Hepatitis B vaccine.** / Consult your health care provider.  Haemophilus influenzae type b (Hib) vaccine.** / Consult your health care provider. Ages 41 years and over  Blood pressure check.** / Every year.  Lipid and cholesterol check.** / Every 5 years beginning at age 71 years.  Lung cancer screening. / Every year if you are aged 64-80 years and have a 30-pack-year history of smoking and currently smoke or have quit within the past 15 years. Yearly screening is stopped once you have quit smoking for at least 15 years or develop a health problem that would prevent you from having lung cancer treatment.  Clinical breast exam.** / Every year after age 56 years.  BRCA-related cancer risk assessment.** / For women who have family  members with a BRCA-related cancer (breast, ovarian, tubal, or peritoneal cancers).  Mammogram.** / Every year beginning at age 66 years and continuing for as long as you are in good health. Consult with your health care provider.  Pap test.** / Every 3 years starting at age 56 years through age 48 or 46 years with 3 consecutive normal Pap tests. Testing can be stopped between 65 and 70 years with 3 consecutive normal Pap tests and no abnormal Pap or HPV tests in the past 10 years.  HPV screening.** / Every 3 years from ages 49 years through ages 35 or 72 years with a history of 3 consecutive normal Pap tests. Testing can be stopped between 65 and 70 years with 3 consecutive normal Pap tests and no abnormal Pap or HPV tests in the past 10 years.  Fecal occult blood test (FOBT) of stool. / Every year beginning at age 48 years and continuing until age 26 years. You may not need to do this test if you get a colonoscopy every 10 years.  Flexible sigmoidoscopy or colonoscopy.** /  Every 5 years for a flexible sigmoidoscopy or every 10 years for a colonoscopy beginning at age 72 years and continuing until age 21 years.  Hepatitis C blood test.** / For all people born from [birth year] through 1965 and any individual with known risks for hepatitis C.  Osteoporosis screening.** / A one-time screening for women ages 74 years and over and women at risk for fractures or osteoporosis.  Skin self-exam. / Monthly.  Influenza vaccine. / Every year.  Tetanus, diphtheria, and acellular pertussis (Tdap/Td) vaccine.** / 1 dose of Td every 10 years.  Varicella vaccine.** / Consult your health care provider.  Zoster vaccine.** / 1 dose for adults aged 18 years or older.  Pneumococcal 13-valent conjugate (PCV13) vaccine.** / Consult your health care provider.  Pneumococcal polysaccharide (PPSV23) vaccine.** / 1 dose for all adults aged 53 years and older.  Meningococcal vaccine.** / Consult your health care  provider.  Hepatitis A vaccine.** / Consult your health care provider.  Hepatitis B vaccine.** / Consult your health care provider.  Haemophilus influenzae type b (Hib) vaccine.** / Consult your health care provider. ** Family history and personal history of risk and conditions may change your health care provider's recommendations.   This information is not intended to replace advice given to you by your health care provider. Make sure you discuss any questions you have with your health care provider.   Document Released: 03/17/2001 Document Revised: 02/09/2014 Document Reviewed: 06/16/2010 Elsevier Interactive Patient Education 2016 [place] you for enrolling in [facility]. Please follow the instructions below to securely access your online medical record. MyChart allows you to send messages to your doctor, view your test results, renew your prescriptions, schedule appointments, and more.  How Do I Sign Up? 10. In your Internet browser, go to [email or web address]. 11. Click on the New  User? link in the Sign In box.  61. Enter your MyChart Access Code exactly as it appears below. You will not need to use this code after you have completed the sign-up process. If you do not sign up before the expiration date, you must request a new code. MyChart Access Code: [email or web address] Expires: 09/21/2015 11:49 AM  13. Enter the last four digits of your Social Security Number (xxxx) and Date of Birth (mm/dd/yyyy) as indicated and click Next. You will be taken to the next sign-up page. 14. Create a MyChart ID. This will be your MyChart login ID and cannot be changed, so think of one that is secure and easy to remember. 46. Create a MyChart password. You can change your password at any time. 16. Enter your Password Reset Question and Answer and click Next. This can be used at a later time if you forget your password.  52. Select your communication preference, and if  applicable enter your e-mail address. You will receive e-mail notification when new information is available in MyChart by choosing to receive e-mail notifications and filling in your e-mail. 18. Click Sign In. You can now view your medical record.   Additional Information If you have questions, you can email [email or web address]  [email or web address] or call [phone] to talk to our [facility] staff. Remember, MyChart is NOT to be used for urgent needs. For medical emergencies, dial 911.

## 2015-08-12 LAB — CYTOLOGY - PAP

## 2015-08-14 ENCOUNTER — Encounter: Payer: Self-pay | Admitting: Primary Care

## 2015-08-14 ENCOUNTER — Ambulatory Visit (INDEPENDENT_AMBULATORY_CARE_PROVIDER_SITE_OTHER): Payer: BLUE CROSS/BLUE SHIELD | Admitting: Primary Care

## 2015-08-14 VITALS — BP 122/84 | HR 89 | Temp 98.8°F | Wt 151.8 lb

## 2015-08-14 DIAGNOSIS — R5383 Other fatigue: Secondary | ICD-10-CM

## 2015-08-14 LAB — CBC
HCT: 41.7 % (ref 36.0–46.0)
HEMOGLOBIN: 13.4 g/dL (ref 12.0–15.0)
MCHC: 32.1 g/dL (ref 30.0–36.0)
MCV: 78 fl (ref 78.0–100.0)
PLATELETS: 228 10*3/uL (ref 150.0–400.0)
RBC: 5.35 Mil/uL — AB (ref 3.87–5.11)
RDW: 15.8 % — ABNORMAL HIGH (ref 11.5–15.5)
WBC: 6.5 10*3/uL (ref 4.0–10.5)

## 2015-08-14 LAB — BASIC METABOLIC PANEL
BUN: 16 mg/dL (ref 6–23)
CALCIUM: 9.8 mg/dL (ref 8.4–10.5)
CO2: 28 mEq/L (ref 19–32)
Chloride: 99 mEq/L (ref 96–112)
Creatinine, Ser: 0.92 mg/dL (ref 0.40–1.20)
GFR: 84.34 mL/min (ref >60.00)
GLUCOSE: 157 mg/dL — AB (ref 70–99)
Potassium: 4 mEq/L (ref 3.5–5.1)
Sodium: 134 mEq/L — ABNORMAL LOW (ref 135–145)

## 2015-08-14 LAB — TSH: TSH: 1.11 u[IU]/mL (ref 0.35–4.50)

## 2015-08-14 LAB — IBC PANEL
IRON: 77 ug/dL (ref 42–145)
Saturation Ratios: 19 % — ABNORMAL LOW (ref 20.0–50.0)
Transferrin: 289 mg/dL (ref 212.0–360.0)

## 2015-08-14 LAB — VITAMIN B12: Vitamin B-12: 1050 pg/mL — ABNORMAL HIGH (ref 211–911)

## 2015-08-14 LAB — VITAMIN D 25 HYDROXY (VIT D DEFICIENCY, FRACTURES): VITD: 19.53 ng/mL — ABNORMAL LOW (ref 30.00–100.00)

## 2015-08-14 NOTE — Progress Notes (Signed)
Subjective:    Patient ID: Ellen [name], female    DOB: [birth year], 45 y.o.   MRN: [id number]  HPI  Ms. Mishkin is a 45 year old female with a history of hyperglycemia, essential hypertension, and anemia who presents today with a chief complaint of fatigue. Her fatigue has been present since Friday last week. She experiences fatigue after walking 5 minutes outside in the heat. After eating she feels more tired, even when eating/drinking foods with vitamins and energy.  She's compliant to her prenatal iron tablets. Denies nausea, polyuria, cold intolerance, fevers, abdominal pain, weakness, changes in vision, depression, heavy menstrual cycles.  Recent blood work completed in mid June 2017 with A1C of 6.0, stable CMP.   Review of Systems  Constitutional: Positive for fatigue. Negative for fever.  HENT: Negative for rhinorrhea.   Respiratory: Negative for cough and shortness of breath.   Cardiovascular: Negative for chest pain.  Gastrointestinal: Negative for nausea, vomiting and blood in stool.  Endocrine: Positive for heat intolerance. Negative for cold intolerance and polyuria.  Genitourinary: Negative for vaginal bleeding.       Denies heavy periods  Neurological: Negative for weakness and light-headedness.       Past Medical History  Diagnosis Date  . Allergic rhinitis due to pollen   . Hyperlipidemia   . DM (diabetes mellitus), gestational   . 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 Migraine   . Seborrheic dermatitis   . Hypertension      Social History   Social History  . Marital Status: Married    Spouse Name: N/A  . Number of Children: 2  . Years of Education: N/A   Occupational History  . Account manager    Social History Main Topics  . Smoking status: Never Smoker   . Smokeless tobacco: Never Used  . Alcohol Use: No  . Drug Use: No  . Sexual Activity: Yes    Birth Control/ Protection: None   Other Topics Concern  . Not on file    Social History Narrative    Past Surgical History  Procedure Laterality Date  . Pilonidal cyst excision    . Uterine fibroid surgery    . Hystosalpingogram    . Cesarean section      x 2     Family History  Problem Relation Age of Onset  . Liver cancer Mother   . Diabetes Father   . Heart disease Father   . Migraines Sister   . Migraines Sister   . Migraines Sister   . Colon cancer Neg Hx     No Known Allergies  Current Outpatient Prescriptions on File Prior to Visit  Medication Sig Dispense Refill  . Blood Glucose Monitoring Suppl (FREESTYLE LITE) DEVI Reported on 06/12/2015  0  . dicyclomine (BENTYL) 10 MG capsule Take 1 capsule (10 mg total) by mouth 3 (three) times daily before meals. 16 capsule 3  . fluticasone (FLONASE) 50 MCG/ACT nasal spray Place 2 sprays into both nostrils daily. 16 g 11  . glucose blood (FREESTYLE TEST STRIPS) test strip Use as instructed 100 each 12  . glucose monitoring kit (FREESTYLE) monitoring kit 1 each by Does not apply route as needed for other. DX E11.9 1 each 0  . hydrochlorothiazide (HYDRODIURIL) 25 MG tablet TAKE 1 TABLET (25 MG TOTAL) BY MOUTH DAILY. 30 tablet 11  . ipratropium (ATROVENT) 0.06 % nasal spray Place 2 sprays into both nostrils 4 (four) times daily.  15 mL 1  . Lancets (FREESTYLE) lancets Use as instructed 100 each 12  . levocetirizine (XYZAL) 5 MG tablet Take 1 tablet (5 mg total) by mouth every evening. 90 tablet 3  . montelukast (SINGULAIR) 10 MG tablet Take 1 tablet (10 mg total) by mouth daily. 90 tablet 3  . Prenatal Vit-Fe Fumarate-FA (PRENATAL MULTIVITAMIN) TABS tablet Take 1 tablet by mouth daily at 12 noon.     No current facility-administered medications on file prior to visit.    BP 122/84 mmHg  Pulse 89  Temp(Src) 98.8 F (37.1 C) (Oral)  Wt 151 lb 12 oz (68.833 kg)  SpO2 99%  LMP 07/29/2015    Objective:   Physical Exam  Constitutional: She appears well-nourished. She does not appear ill.   HENT:  Mouth/Throat: Oropharynx is clear and moist.  Neck: Neck supple.  Cardiovascular: Normal rate and regular rhythm.   Pulmonary/Chest: Effort normal and breath sounds normal.  Abdominal: Soft. Bowel sounds are normal.  Musculoskeletal:  Good strength bilaterally  Skin: Skin is warm and dry.  Psychiatric: She has a normal mood and affect.          Assessment & Plan:  Fatigue:  Present for the past 5 days, especially when outside in the heat. Exam without evidence of acute infection, and is unremarkable. She does not appear sickly or weak. Does not seem to be perimenopausal as periods are regular and without heavy bleeding. Given history of iron def anemia will check Iron panel, CBC. Also check TSH, BMP, Vitamin B 12 and D levels. A1C stable in June. Suspect fatigue attributed to heat and humidity, but will await labs to confirm.  Sheral Flow, NP

## 2015-08-14 NOTE — Progress Notes (Signed)
Pre visit review using our clinic review tool, if applicable. No additional management support is needed unless otherwise documented below in the visit note. 

## 2015-08-14 NOTE — Patient Instructions (Signed)
Complete lab work prior to leaving today. I will notify you of your results once received.   Please notify myself or Dr. Glori Bickers if you experience any fevers, abdominal pain, nausea, shortness of breath.  It was a pleasure meeting you!

## 2015-09-26 ENCOUNTER — Telehealth: Payer: Self-pay

## 2015-09-26 MED ORDER — EFLORNITHINE HCL 13.9 % EX CREA: TOPICAL_CREAM | CUTANEOUS | 3 refills | Status: DC

## 2015-09-26 NOTE — Telephone Encounter (Signed)
Px sent

## 2015-09-26 NOTE — Telephone Encounter (Signed)
Pt left v/m requesting the generic Vaniqua to [facility] . Noted in 07/24/15 office note could try Vaniqua for hair growth. Do not see on current or hx med list.Please advise.

## 2015-12-03 ENCOUNTER — Telehealth: Payer: Self-pay

## 2015-12-03 NOTE — Telephone Encounter (Signed)
Her waist circumference is 31 inches. Pt said you should have the phone number

## 2015-12-03 NOTE — Telephone Encounter (Signed)
Form faxed and sent to scanning

## 2015-12-06 ENCOUNTER — Other Ambulatory Visit: Payer: Self-pay | Admitting: *Deleted

## 2015-12-06 MED ORDER — HYDROCHLOROTHIAZIDE 25 MG PO TABS: ORAL_TABLET | ORAL | 5 refills | Status: DC

## 2015-12-17 ENCOUNTER — Telehealth: Payer: Self-pay

## 2015-12-17 DIAGNOSIS — R110 Nausea: Secondary | ICD-10-CM | POA: Insufficient documentation

## 2015-12-17 MED ORDER — PROMETHAZINE HCL 25 MG PO TABS: 12.5000 mg | ORAL_TABLET | Freq: Three times a day (TID) | ORAL | 1 refills | Status: DC | PRN

## 2015-12-17 MED ORDER — ONDANSETRON 8 MG PO TBDP: 8.0000 mg | ORAL_TABLET | Freq: Three times a day (TID) | ORAL | 1 refills | Status: DC | PRN

## 2015-12-17 NOTE — Telephone Encounter (Signed)
I sent it in -that is fine Some folks do get a little sleepy with zofran as well so use caution  Hope it helps

## 2015-12-17 NOTE — Telephone Encounter (Signed)
Pt left v/m; pt usually has nausea when has menstrual cycle and pt usually stays in bed and rest when nauseated but now pt has a job and request med for [facility] to [facility]. Pt request cb. Pt last saw Dr Glori Bickers 07/24/15.Please advise.

## 2015-12-17 NOTE — Telephone Encounter (Signed)
I sent in phenergan  Please warn her it may sedate (hope it won't)

## 2015-12-17 NOTE — Telephone Encounter (Signed)
Pt said that the phenergan did make her drowsy and she needs something she can take while at work, pt is requesting a Rx for zofran she has taken that in the past with no problems please advise

## 2015-12-17 NOTE — Telephone Encounter (Signed)
Pt notified Rx sent and phenergan Rx cancelled with pharmacy and advise to fill the zofran instead

## 2015-12-18 ENCOUNTER — Other Ambulatory Visit: Payer: Self-pay | Admitting: *Deleted

## 2015-12-18 DIAGNOSIS — K589 Irritable bowel syndrome without diarrhea: Secondary | ICD-10-CM

## 2015-12-18 MED ORDER — DICYCLOMINE HCL 10 MG PO CAPS
10.0000 mg | ORAL_CAPSULE | Freq: Three times a day (TID) | ORAL | 3 refills | Status: DC
Start: 2015-12-18 — End: 2016-12-30

## 2015-12-18 NOTE — Telephone Encounter (Signed)
Refill request for Bentyl 10mg  received from pharmacy.  Sent refill to pharmacy per Dr Ernestina Patches approval.

## 2016-01-19 ENCOUNTER — Other Ambulatory Visit: Payer: Self-pay | Admitting: Family Medicine

## 2016-02-20 ENCOUNTER — Other Ambulatory Visit: Payer: Self-pay | Admitting: Family Medicine

## 2016-02-20 DIAGNOSIS — Z1231 Encounter for screening mammogram for malignant neoplasm of breast: Secondary | ICD-10-CM

## 2016-02-26 ENCOUNTER — Other Ambulatory Visit: Payer: Self-pay | Admitting: *Deleted

## 2016-02-26 MED ORDER — LEVOCETIRIZINE DIHYDROCHLORIDE 5 MG PO TABS: 5.0000 mg | ORAL_TABLET | Freq: Every evening | ORAL | 0 refills | Status: DC

## 2016-03-11 ENCOUNTER — Ambulatory Visit (INDEPENDENT_AMBULATORY_CARE_PROVIDER_SITE_OTHER): Payer: Managed Care, Other (non HMO)

## 2016-03-11 DIAGNOSIS — Z23 Encounter for immunization: Secondary | ICD-10-CM | POA: Diagnosis not present

## 2016-03-25 ENCOUNTER — Other Ambulatory Visit: Payer: Self-pay | Admitting: Family Medicine

## 2016-03-25 DIAGNOSIS — E559 Vitamin D deficiency, unspecified: Secondary | ICD-10-CM

## 2016-03-25 DIAGNOSIS — D509 Iron deficiency anemia, unspecified: Secondary | ICD-10-CM

## 2016-03-25 DIAGNOSIS — R739 Hyperglycemia, unspecified: Secondary | ICD-10-CM

## 2016-03-25 DIAGNOSIS — I10 Essential (primary) hypertension: Secondary | ICD-10-CM

## 2016-03-25 DIAGNOSIS — E7800 Pure hypercholesterolemia, unspecified: Secondary | ICD-10-CM

## 2016-03-25 NOTE — Telephone Encounter (Signed)
This is for iron deficiency  Refill for 3 mo  Her last cbc was normal in the summer but iron sat was still slt low  We should re check labs in may for iron and cbc  Thanks

## 2016-03-25 NOTE — Telephone Encounter (Signed)
Not on med list, please advise 

## 2016-03-26 NOTE — Telephone Encounter (Signed)
Pt returned your call. Please call [phone]

## 2016-03-26 NOTE — Telephone Encounter (Signed)
Rx filled  Called pt and no answer so left voicemail requesting pt to call the office back

## 2016-03-27 NOTE — Telephone Encounter (Signed)
appt scheduled for May but pt is asking to get a full lab work up since it's been almost a year, she would like to have her vit D, and a1c rechecked and any other labs you want her to have like a lipid or CMet, please put orders in EPIC, thanks

## 2016-03-29 DIAGNOSIS — E559 Vitamin D deficiency, unspecified: Secondary | ICD-10-CM | POA: Insufficient documentation

## 2016-03-29 NOTE — Addendum Note (Signed)
Addended by: Loura Pardon A on: 03/29/2016 08:02 PM   Modules accepted: Orders

## 2016-03-29 NOTE — Telephone Encounter (Signed)
I put the orders in 

## 2016-03-29 NOTE — Addendum Note (Signed)
Addended by: Loura Pardon A on: 03/29/2016 08:00 PM   Modules accepted: Orders

## 2016-06-11 ENCOUNTER — Other Ambulatory Visit: Payer: Self-pay | Admitting: *Deleted

## 2016-06-11 NOTE — Telephone Encounter (Signed)
Pt hasn't had an OV in almost a year, and canceled her lab appt that was scheduled on 06/16/16, please advise

## 2016-06-11 NOTE — Telephone Encounter (Signed)
Please schedule f/u in July and refill until then

## 2016-06-12 MED ORDER — SE-TAN PLUS 162-115.2-1 MG PO CAPS: 1.0000 | ORAL_CAPSULE | Freq: Every day | ORAL | 0 refills | Status: DC

## 2016-06-12 NOTE — Telephone Encounter (Signed)
Spoke to pt and advised. Rx sent and f/u appt scheduled.

## 2016-06-16 ENCOUNTER — Other Ambulatory Visit: Payer: Managed Care, Other (non HMO)

## 2016-07-15 ENCOUNTER — Ambulatory Visit
Admission: RE | Admit: 2016-07-15 | Discharge: 2016-07-15 | Disposition: A | Discharge status: Home / Self Care | Payer: Managed Care, Other (non HMO) | Source: Ambulatory Visit | Attending: Family Medicine | Admitting: Family Medicine

## 2016-07-15 DIAGNOSIS — Z1231 Encounter for screening mammogram for malignant neoplasm of breast: Secondary | ICD-10-CM

## 2016-07-17 ENCOUNTER — Encounter: Payer: Self-pay | Admitting: *Deleted

## 2016-08-21 ENCOUNTER — Ambulatory Visit (INDEPENDENT_AMBULATORY_CARE_PROVIDER_SITE_OTHER): Payer: BLUE CROSS/BLUE SHIELD | Admitting: Internal Medicine

## 2016-08-21 ENCOUNTER — Encounter: Payer: Self-pay | Admitting: Internal Medicine

## 2016-08-21 ENCOUNTER — Ambulatory Visit (INDEPENDENT_AMBULATORY_CARE_PROVIDER_SITE_OTHER)
Admission: RE | Admit: 2016-08-21 | Discharge: 2016-08-21 | Disposition: A | Discharge status: Home / Self Care | Payer: BLUE CROSS/BLUE SHIELD | Source: Ambulatory Visit | Attending: Internal Medicine | Admitting: Internal Medicine

## 2016-08-21 VITALS — BP 150/110 | HR 83 | Temp 98.4°F | Ht 64.0 in | Wt 162.2 lb

## 2016-08-21 DIAGNOSIS — I10 Essential (primary) hypertension: Secondary | ICD-10-CM | POA: Diagnosis not present

## 2016-08-21 DIAGNOSIS — M898X1 Other specified disorders of bone, shoulder: Secondary | ICD-10-CM

## 2016-08-21 DIAGNOSIS — R079 Chest pain, unspecified: Secondary | ICD-10-CM | POA: Diagnosis not present

## 2016-08-21 DIAGNOSIS — M549 Dorsalgia, unspecified: Secondary | ICD-10-CM

## 2016-08-21 MED ORDER — IBUPROFEN 600 MG PO TABS: 600.0000 mg | ORAL_TABLET | Freq: Three times a day (TID) | ORAL | 0 refills | Status: DC | PRN

## 2016-08-21 MED ORDER — CYCLOBENZAPRINE HCL 5 MG PO TABS: 5.0000 mg | ORAL_TABLET | Freq: Three times a day (TID) | ORAL | 0 refills | Status: DC | PRN

## 2016-08-21 NOTE — Patient Instructions (Signed)
This is probably    musculoskeletal pain   Take 600 ibuprofen with food  As needed.  Muscle relaxant with caution.  Get chest x ray  Check bp readings twice a day until you see dr Glori Bickers and try to go back on meds.     Exam is reassuring  Otherwise .

## 2016-08-21 NOTE — Progress Notes (Signed)
No chief complaint on file.   HPI: Ellen [name] 46 y.o.  sda pcp na last visit in system  2018    About 1 week of sx and then worse today   Upper back and nexk    Had chest   Sx relievd    By low does advil  But upper neck and shoulder blades  Sides  are painful and tight   No hx  Neck   No hx of  Trauma    Had achiness but no feer .  No   Radiated to limbs to shoulder balds.  Has allergys lately . sneeing watery eys.    Taking    Hx of allergies  nauseaous and side effect .  ROS: See pertinent positives and negatives per HPI. No fever hemoptysis  sob  Hx of    Gest dm   Ok  Moving to [place] [employer]  No injury   Stopped the bp med for 1 week  cause of nausea and hard to take all of this med  Is  menstrual  Past Medical History:  Diagnosis Date  . Allergic rhinitis due to pollen   . DM (diabetes mellitus), gestational   . 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 Hyperlipidemia   . Hypertension   . Migraine   . Seborrheic dermatitis     Family History  Problem Relation Age of Onset  . Liver cancer Mother   . Diabetes Father   . Heart disease Father   . Migraines Sister   . Migraines Sister   . Migraines Sister   . Colon cancer Neg Hx     Social History   Social History  . Marital status: Married    Spouse name: N/A  . Number of children: 2  . Years of education: N/A   Occupational History  . Account manager    Social History Main Topics  . Smoking status: Never Smoker  . Smokeless tobacco: Never Used  . Alcohol use No  . Drug use: No  . Sexual activity: Yes    Birth control/ protection: None   Other Topics Concern  . Not on file   Social History Narrative  . No narrative on file    Outpatient Medications Prior to Visit  Medication Sig Dispense Refill  . dicyclomine (BENTYL) 10 MG capsule Take 1 capsule (10 mg total) by mouth 3 (three) times daily before meals. 16 capsule 3  . Eflornithine HCl (VANIQA) 13.9 % cream Apply thin layer to  affected areas of hair growth twice daily at least 8 hours apart 45 g 3  . FeFum-FePo-FA-B Cmp-C-Zn-Mn-Cu (SE-TAN PLUS) 162-115.2-1 MG CAPS Take 1 capsule by mouth daily. 90 capsule 0  . fluticasone (FLONASE) 50 MCG/ACT nasal spray Place 2 sprays into both nostrils daily. 16 g 11  . hydrochlorothiazide (HYDRODIURIL) 25 MG tablet TAKE 1 TABLET (25 MG TOTAL) BY MOUTH DAILY. 30 tablet 5  . ipratropium (ATROVENT) 0.06 % nasal spray Place 2 sprays into both nostrils 4 (four) times daily. 15 mL 1  . levocetirizine (XYZAL) 5 MG tablet Take 1 tablet (5 mg total) by mouth every evening. 90 tablet 0  . montelukast (SINGULAIR) 10 MG tablet TAKE 1 TABLET (10 MG TOTAL) BY MOUTH DAILY. 90 tablet 1  . ondansetron (ZOFRAN-ODT) 8 MG disintegrating tablet Take 1 tablet (8 mg total) by mouth every 8 (eight) hours as needed for nausea or vomiting. 30 tablet 1  . Prenatal Vit-Fe Fumarate-FA (  PRENATAL MULTIVITAMIN) TABS tablet Take 1 tablet by mouth daily at 12 noon.    . Blood Glucose Monitoring Suppl (FREESTYLE LITE) DEVI Reported on 06/12/2015  0  . glucose blood (FREESTYLE TEST STRIPS) test strip Use as instructed 100 each 12  . glucose monitoring kit (FREESTYLE) monitoring kit 1 each by Does not apply route as needed for other. DX E11.9 1 each 0  . Lancets (FREESTYLE) lancets Use as instructed 100 each 12   No facility-administered medications prior to visit.      EXAM:  BP (!) 150/110 (BP Location: Right Arm)   Pulse 83   Temp 98.4 F (36.9 C)   Ht _0  (1.626 m)   Wt 162 lb 3.2 oz (73.6 kg)   LMP 07/26/2016   BMI 27.84 kg/m   Body mass index is 27.84 kg/m. Repeat bp was 150/100 r arm  GENERAL: vitals reviewed and listed above, alert, oriented, appears well hydrated and in no acute distress HEENT: atraumatic, conjunctiva  clear, no obvious abnormalities on inspection of external nose and ears OP : no lesion edema or exudate  NECK: no obvious masses on inspection palpation   Tight trap  Nl rom    LUNGS: clear to auscultation bilaterally, no wheezes, rales or rhonchi, good air movement CV: HRRR, no clubbing cyanosis or  peripheral edema nl cap refill  Abdomen:  Sof,t normal bowel sounds without hepatosplenomegaly, no guarding rebound or masses no CVA tenderness MS: moves all extremities without noticeable focal  Abnormality Neuro non focal  PSYCH: pleasant and cooperative, no obvious depression or anxiety  ASSESSMENT AND PLAN:  Discussed the following assessment and plan:  Upper back pain - Plan: DG Chest 2 View  Chest pain, unspecified type - Plan: DG Chest 2 View  Shoulder blade pain  Essential hypertension - up today ? pain and off med  get readings and  fy next week try to go bac k on  hctz for now ibu in short run and low dose m relaxant trial get x ray  Doesn't sound CV cause   Poss ms  -Patient advised to return or notify health care team  if symptoms worsen ,persist or new concerns arise. Total visit 67mns > 50% spent counseling and coordinating care as indicated in above note and in instructions to patient .  Patient Instructions  This is probably    musculoskeletal pain   Take 600 ibuprofen with food  As needed.  Muscle relaxant with caution.  Get chest x ray  Check bp readings twice a day until you see dr TGlori Bickersand try to go back on meds.     Exam is reassuring  Otherwise .     WStandley Brooking Panosh M.D.

## 2016-08-25 ENCOUNTER — Ambulatory Visit: Payer: Self-pay | Admitting: Family Medicine

## 2016-09-02 ENCOUNTER — Other Ambulatory Visit: Payer: BLUE CROSS/BLUE SHIELD

## 2016-09-08 ENCOUNTER — Telehealth: Payer: Self-pay

## 2016-09-08 ENCOUNTER — Encounter: Payer: Self-pay | Admitting: Family Medicine

## 2016-09-08 MED ORDER — MELOXICAM 15 MG PO TABS: 15.0000 mg | ORAL_TABLET | Freq: Every day | ORAL | 0 refills | Status: DC

## 2016-09-08 NOTE — Telephone Encounter (Signed)
I sent in meloxicam  Do not take ibuprofen or aleve or asa with this  Please ask how her symptoms are  F/u if no improvement

## 2016-09-08 NOTE — Telephone Encounter (Signed)
Thanks- f/u if this does not help

## 2016-09-08 NOTE — Telephone Encounter (Signed)
Pt left v/m requesting med that pt took about 3 yrs ago for inflammation of chest wall (pt does not know name of med) [facility] [place] [place]. Pt request cb.last f/u 07/24/15 but pt has appt scheduled for f/u on 09/14/16.

## 2016-09-08 NOTE — Telephone Encounter (Signed)
Pt said if she over does it and lifts something heavy she gets a Pulling/painful feeling around chest area, it's worse at night when she is laying down. Pt notified Rx sent to pharmacy and advise of Dr. Marliss Coots comments

## 2016-09-09 ENCOUNTER — Ambulatory Visit: Payer: Self-pay | Admitting: Family Medicine

## 2016-09-12 ENCOUNTER — Other Ambulatory Visit: Payer: Self-pay | Admitting: Family Medicine

## 2016-09-14 ENCOUNTER — Ambulatory Visit (INDEPENDENT_AMBULATORY_CARE_PROVIDER_SITE_OTHER): Payer: BLUE CROSS/BLUE SHIELD | Admitting: Family Medicine

## 2016-09-14 ENCOUNTER — Encounter: Payer: Self-pay | Admitting: Family Medicine

## 2016-09-14 VITALS — BP 140/80 | HR 96 | Temp 98.9°F | Ht 64.0 in | Wt 164.2 lb

## 2016-09-14 DIAGNOSIS — R110 Nausea: Secondary | ICD-10-CM

## 2016-09-14 DIAGNOSIS — R739 Hyperglycemia, unspecified: Secondary | ICD-10-CM

## 2016-09-14 DIAGNOSIS — Z111 Encounter for screening for respiratory tuberculosis: Secondary | ICD-10-CM

## 2016-09-14 DIAGNOSIS — I10 Essential (primary) hypertension: Secondary | ICD-10-CM | POA: Diagnosis not present

## 2016-09-14 DIAGNOSIS — E559 Vitamin D deficiency, unspecified: Secondary | ICD-10-CM

## 2016-09-14 DIAGNOSIS — E7800 Pure hypercholesterolemia, unspecified: Secondary | ICD-10-CM | POA: Diagnosis not present

## 2016-09-14 MED ORDER — HYDROCHLOROTHIAZIDE 25 MG PO TABS: ORAL_TABLET | ORAL | 3 refills | Status: DC

## 2016-09-14 MED ORDER — MONTELUKAST SODIUM 10 MG PO TABS: 10.0000 mg | ORAL_TABLET | Freq: Every day | ORAL | 3 refills | Status: DC | PRN

## 2016-09-14 MED ORDER — ONDANSETRON 8 MG PO TBDP: 8.0000 mg | ORAL_TABLET | Freq: Three times a day (TID) | ORAL | 3 refills | Status: AC | PRN

## 2016-09-14 NOTE — Assessment & Plan Note (Signed)
This occurs with menses  She will f/u with gyn soon  ? If her bp would tolerate OC  She has also considered hysterectomy

## 2016-09-14 NOTE — Assessment & Plan Note (Signed)
Watching diet  Will get back to exercise once moved  Lab planned for 2 d (A1C) disc imp of low glycemic diet and wt loss to prevent DM2

## 2016-09-14 NOTE — Assessment & Plan Note (Signed)
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

## 2016-09-14 NOTE — Assessment & Plan Note (Signed)
Disc goals for lipids and reasons to control them Rev labs with pt  (last draw) Fasting lipid profile planned for later this week  Rev low sat fat diet in detail

## 2016-09-14 NOTE — Patient Instructions (Addendum)
Once you get settled in- start back with regular exercise   Your BP is 140/80  I hope it gets better with regular exercise  Keep an eye on this   TB skin test today  Follow up for reading in 2 days and let's do fasting labs that day

## 2016-09-14 NOTE — Assessment & Plan Note (Signed)
Will check this with fasting labs later this week

## 2016-09-14 NOTE — Progress Notes (Signed)
Subjective:    Patient ID: Ellen [name], female    DOB: 01-29-1971, 46 y.o.   MRN: [id number]  HPI Here for f/u of chronic health problems   Doing well overall  Summer is going well  In the process of moving  She hurt her back and saw Dr Regis Bill - it is getting better  Also strained her chest wall lifting- also a lot better   Moving to [place]  She is excited    Wt Readings from Last 3 Encounters:  09/14/16 164 lb 4 oz (74.5 kg)  08/21/16 162 lb 3.2 oz (73.6 kg)  08/14/15 151 lb 12 oz (68.8 kg)  not as much exercise due to moving  Eating healthy  28.19 kg/m  bp is up today but improved from when she saw Dr Regis Bill (was off med due to menstrual nausea)  Is back on med for 2 week  No cp or palpitations or headaches or edema  No side effects to medicines  BP Readings from Last 3 Encounters:  09/14/16 (!) 146/82  08/21/16 (!) 150/110  08/14/15 122/84    Takes hctz  Better on 2nd check 140/80  She has not been exercising lately  Lower when she exercises   For menstrual symptoms- she is thinking about getting a partial hysterectomy  Gets dizzy and nauseated with her menstrual period   Hx of hyperglycemia Lab Results  Component Value Date   HGBA1C 6.0 07/22/2015    Hx of hyperlipidemia Lab Results  Component Value Date   CHOL 210 (H) 07/22/2015   HDL 67.40 07/22/2015   LDLCALC 132 (H) 07/22/2015   LDLDIRECT 125.4 03/18/2010   TRIG 55.0 07/22/2015   CHOLHDL 3 07/22/2015   Hx of vit D def Last level 19.5  She also needs a PPD for sub teaching    Patient Active Problem List   Diagnosis Date Noted  . Vitamin D deficiency 03/29/2016  . Nausea without vomiting 12/17/2015  . Fibroids 05/27/2015  . Menses painful 05/27/2015  . Irregular menses 12/04/2014  . Essential hypertension 07/16/2014  . Routine general medical examination at a health care facility 03/20/2014  . Chest wall pain 12/09/2012  . Anemia, iron deficiency 05/04/2012  . Elevated  blood pressure (not hypertension) 05/04/2012  . Seborrheic dermatitis 09/25/2011  . GANGLION CYST, WRIST, LEFT 03/03/2010  . COMMON MIGRAINE 06/22/2008  . Hyperglycemia 02/15/2008  . DIABETES MELLITUS, GESTATIONAL, HX OF 02/15/2008  . Hyperlipidemia 10/01/2006  . Sinusitis, chronic 10/01/2006  . ALLERGIC RHINITIS 10/01/2006  . ALOPECIA 10/01/2006   Past Medical History:  Diagnosis Date  . Allergic rhinitis due to pollen   . DM (diabetes mellitus), gestational   . 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 Hyperlipidemia   . Hypertension   . Migraine   . Seborrheic dermatitis    Past Surgical History:  Procedure Laterality Date  . CESAREAN SECTION     x 2   . hystosalpingogram    . PILONIDAL CYST EXCISION    . UTERINE FIBROID SURGERY     Social History  Substance Use Topics  . Smoking status: Never Smoker  . Smokeless tobacco: Never Used  . Alcohol use No   Family History  Problem Relation Age of Onset  . Liver cancer Mother   . Diabetes Father   . Heart disease Father   . Migraines Sister   . Migraines Sister   . Migraines Sister   . Colon cancer Neg Hx  No Known Allergies Current Outpatient Prescriptions on File Prior to Visit  Medication Sig Dispense Refill  . dicyclomine (BENTYL) 10 MG capsule Take 1 capsule (10 mg total) by mouth 3 (three) times daily before meals. 16 capsule 3  . FeFum-FePo-FA-B Cmp-C-Zn-Mn-Cu (SE-TAN PLUS) 162-115.2-1 MG CAPS Take 1 capsule by mouth daily. 90 capsule 0  . fluticasone (FLONASE) 50 MCG/ACT nasal spray Place 2 sprays into both nostrils daily. 16 g 11  . ipratropium (ATROVENT) 0.06 % nasal spray Place 2 sprays into both nostrils 4 (four) times daily. (Patient taking differently: Place 2 sprays into both nostrils 2 (two) times daily as needed. ) 15 mL 1  . levocetirizine (XYZAL) 5 MG tablet Take 1 tablet (5 mg total) by mouth every evening. 90 tablet 0  . meloxicam (MOBIC) 15 MG tablet Take 1 tablet (15 mg total) by  mouth daily. With food as needed for pain 30 tablet 0  . Prenatal Vit-Fe Fumarate-FA (PRENATAL MULTIVITAMIN) TABS tablet Take 1 tablet by mouth daily at 12 noon.    . Eflornithine HCl (VANIQA) 13.9 % cream Apply thin layer to affected areas of hair growth twice daily at least 8 hours apart (Patient not taking: Reported on 09/14/2016) 45 g 3   No current facility-administered medications on file prior to visit.     Review of Systems    Review of Systems  Constitutional: Negative for fever, appetite change, fatigue and unexpected weight change.  Eyes: Negative for pain and visual disturbance.  Respiratory: Negative for cough and shortness of breath.   Cardiovascular: Negative for cp or palpitations    Gastrointestinal: Negative for nausea, diarrhea and constipation. (except with menses when she has abd cramps/ frequent stools/n/v which can be severe) Genitourinary: Negative for urgency and frequency.  Skin: Negative for pallor or rash   Neurological: Negative for weakness, light-headedness, numbness and headaches.  Hematological: Negative for adenopathy. Does not bruise/bleed easily.  Psychiatric/Behavioral: Negative for dysphoric mood. The patient is not nervous/anxious.      Objective:   Physical Exam  Constitutional: She appears well-developed and well-nourished. No distress.  Well appearing   HENT:  Head: Normocephalic and atraumatic.  Mouth/Throat: Oropharynx is clear and moist.  Eyes: Pupils are equal, round, and reactive to light. Conjunctivae and EOM are normal.  Neck: Normal range of motion. Neck supple. No JVD present. Carotid bruit is not present. No thyromegaly present.  Cardiovascular: Normal rate, regular rhythm, normal heart sounds and intact distal pulses.  Exam reveals no gallop.   Pulmonary/Chest: Effort normal and breath sounds normal. No respiratory distress. She has no wheezes. She has no rales.  No crackles  Abdominal: Soft. Bowel sounds are normal. She exhibits no  distension, no abdominal bruit and no mass. There is no tenderness.  Musculoskeletal: She exhibits no edema.  Lymphadenopathy:    She has no cervical adenopathy.  Neurological: She is alert. She has normal reflexes. No cranial nerve deficit. She exhibits normal muscle tone. Coordination normal.  Skin: Skin is warm and dry. No rash noted. No pallor.  Psychiatric: She has a normal mood and affect.          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       Relevant Medications   hydrochlorothiazide (HYDRODIURIL) 25 MG tablet   Other Relevant Orders   CBC with Differential/Platelet   Comprehensive metabolic panel   Lipid panel   TSH     Other   Hyperglycemia    Watching diet  Will get back to exercise once moved  Lab planned for 2 d (A1C) disc imp of low glycemic diet and wt loss to prevent DM2       Relevant Orders   Hemoglobin A1c   Hyperlipidemia    Disc goals for lipids and reasons to control them Rev labs with pt  (last draw) Fasting lipid profile planned for later this week  Rev low sat fat diet in detail       Relevant Medications   hydrochlorothiazide (HYDRODIURIL) 25 MG tablet   Nausea without vomiting    This occurs with menses  She will f/u with gyn soon  ? If her bp would tolerate OC  She has also considered hysterectomy      Vitamin D deficiency    Will check this with fasting labs later this week       Relevant Orders   VITAMIN D 25 Hydroxy (Vit-D Deficiency, Fractures)    Other Visit Diagnoses    Visit for TB skin test       Relevant Orders   TB Skin Test (Completed)

## 2016-09-16 LAB — TB SKIN TEST
INDURATION: 0 mm
TB Skin Test: NEGATIVE

## 2016-09-27 IMAGING — MG 2D DIGITAL SCREENING BILATERAL MAMMOGRAM WITH CAD AND ADJUNCT TO
8 of 13 series · 8 of 29 positions shown · non-contrast
Comparison: Previous exam(s).

CLINICAL DATA: Screening.

EXAM:
2D DIGITAL SCREENING BILATERAL MAMMOGRAM WITH CAD AND ADJUNCT TOMO

[R MLO (1 of 2)]
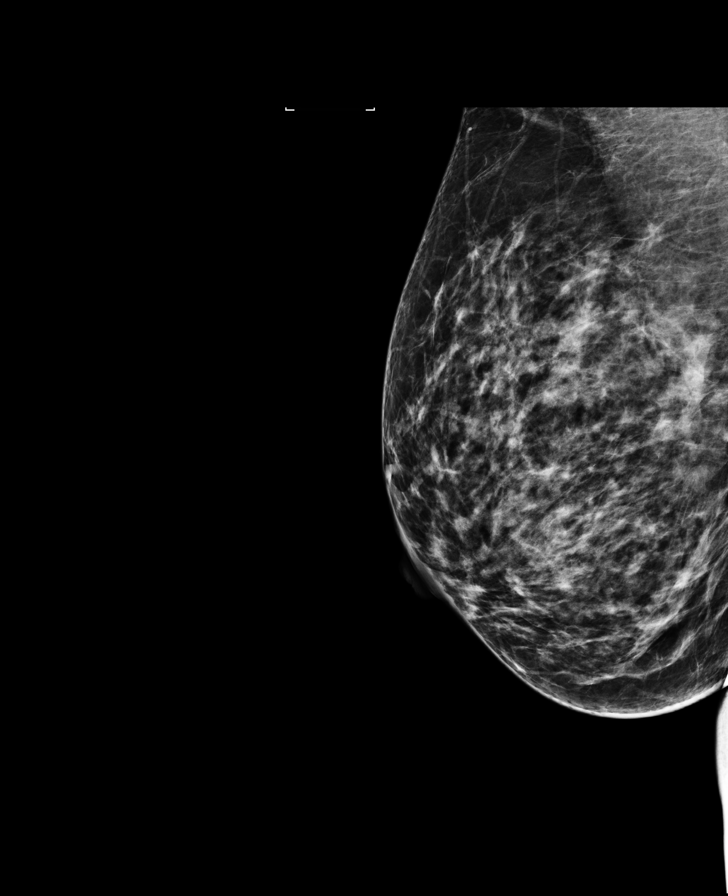

[L CC]
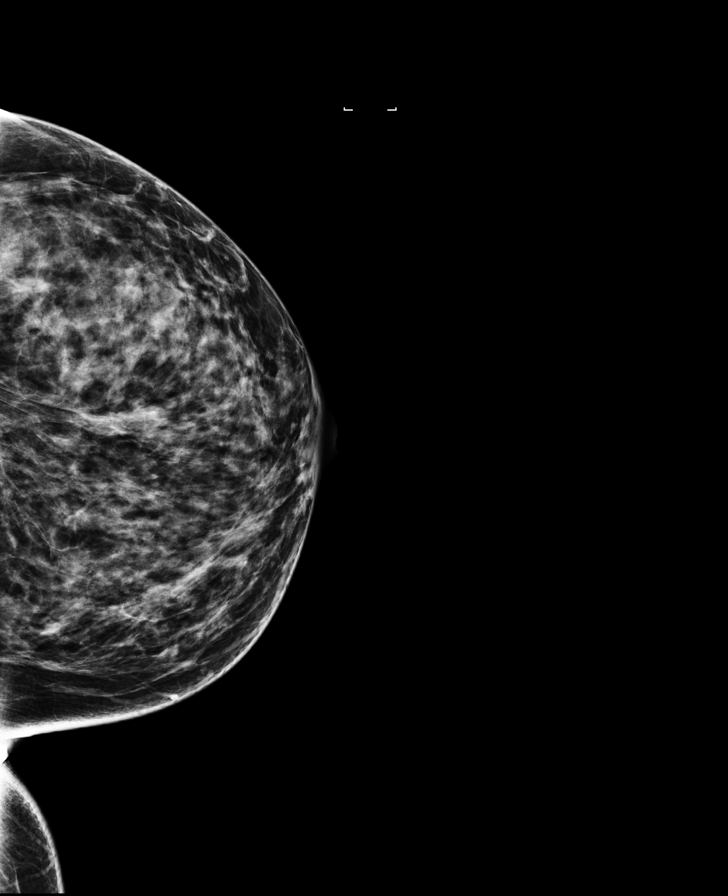

[R CC synth-2D]
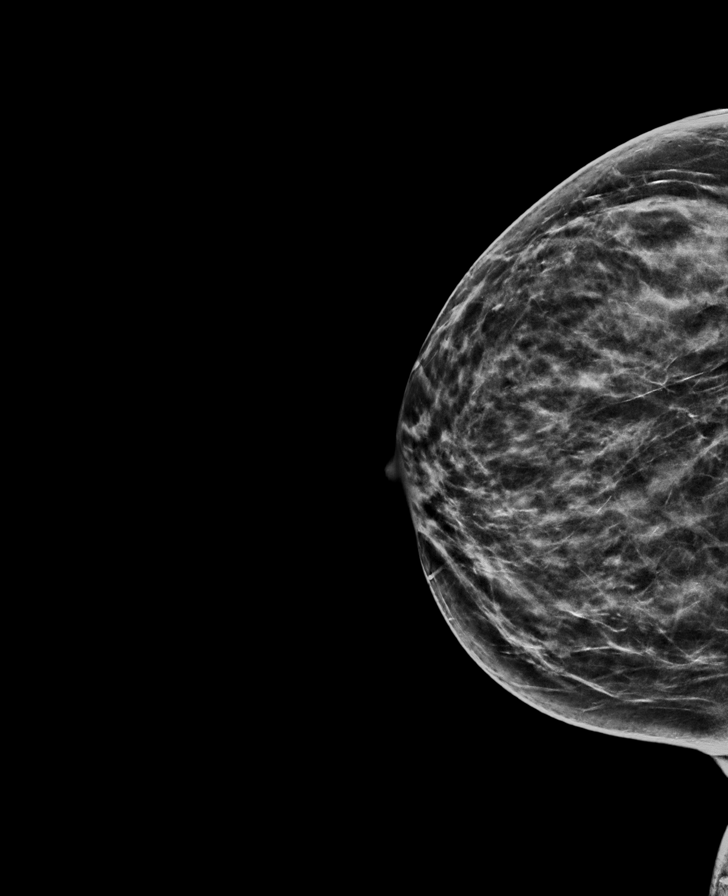

[L MLO synth-2D]
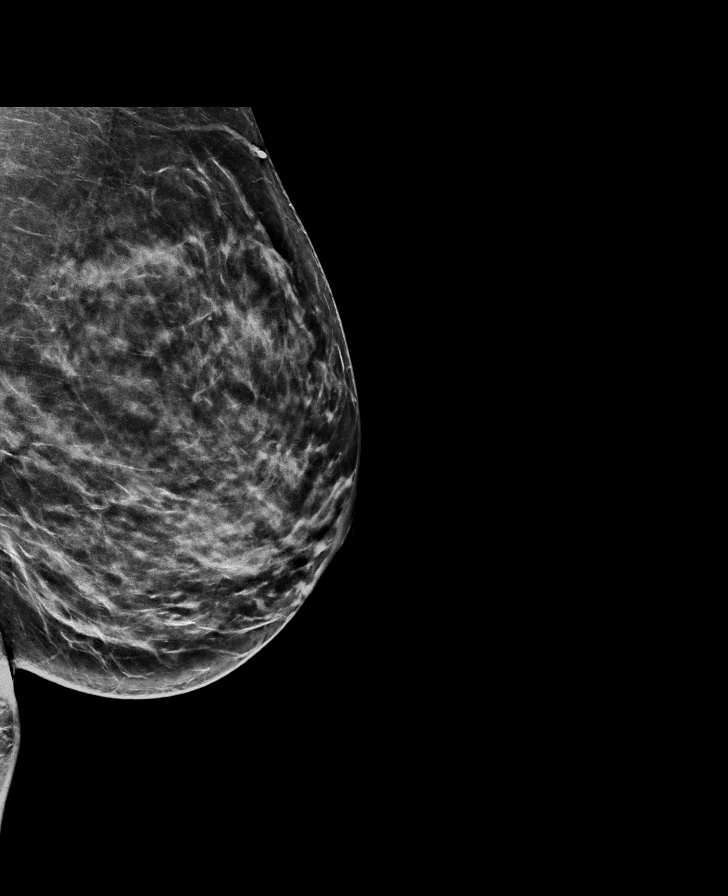

[L MLO]
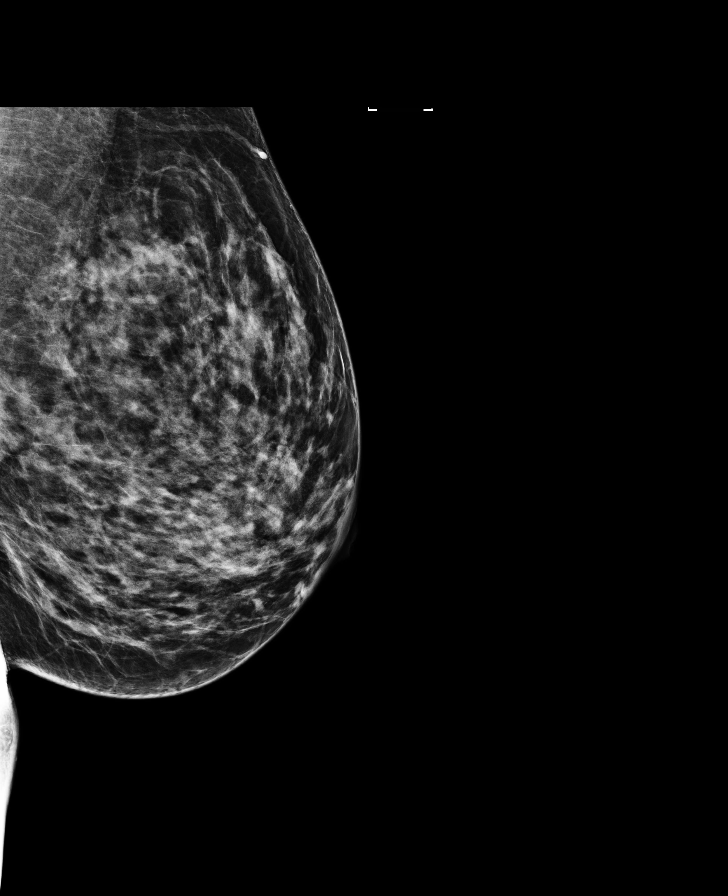

[L CC synth-2D]
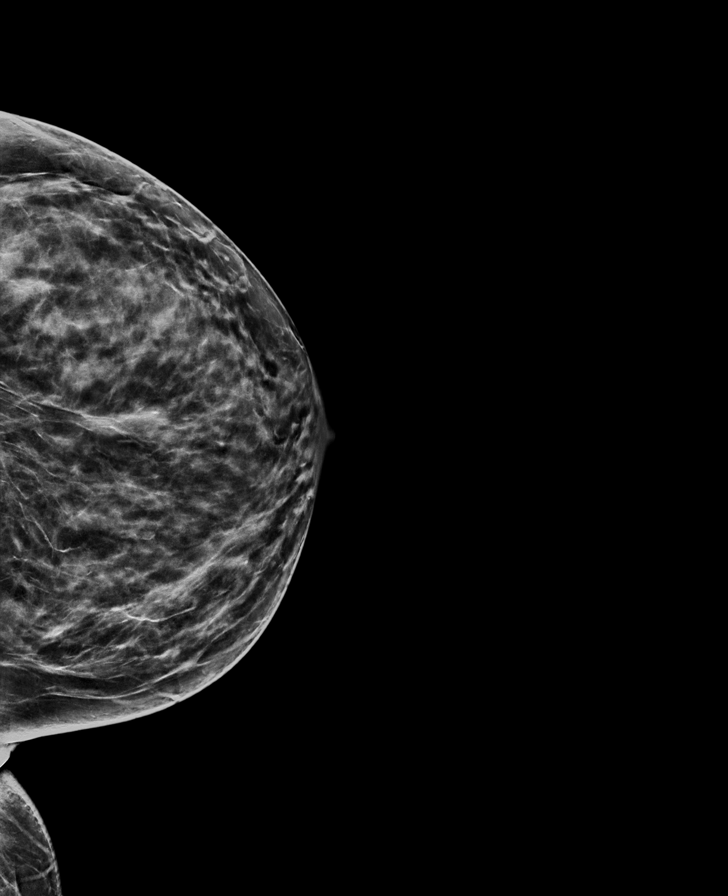

[R MLO (2 of 2)]
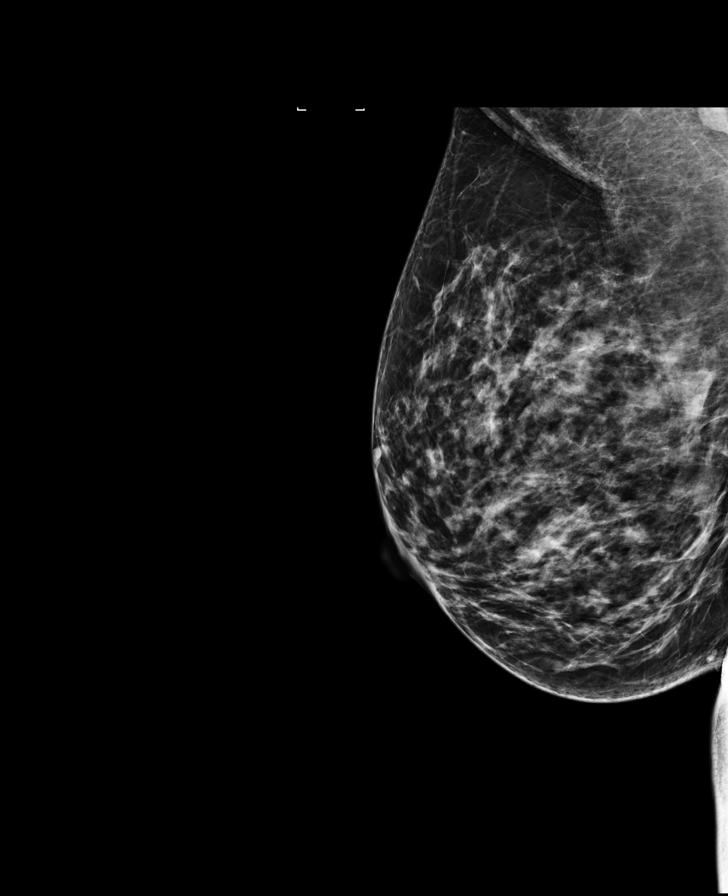

[R MLO synth-2D]
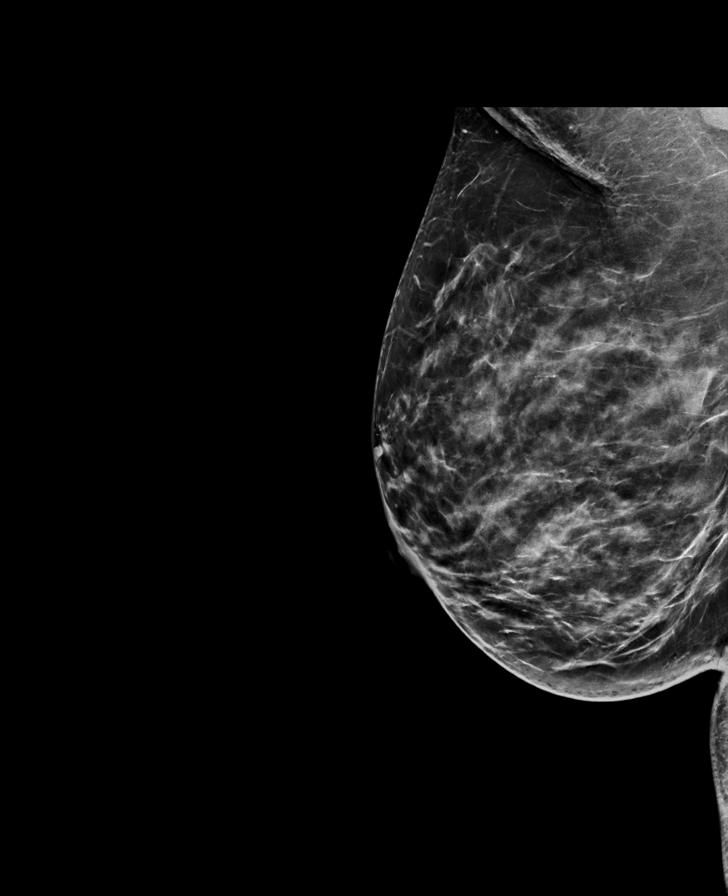

[8 of 29 positions shown; findings below may reference images not displayed]

ACR Breast Density Category c: The breast tissue is heterogeneously
dense, which may obscure small masses.
FINDINGS: There are no findings suspicious for malignancy. Images were
processed with CAD.
IMPRESSION: No mammographic evidence of malignancy. A result letter of this
screening mammogram will be mailed directly to the patient.

RECOMMENDATION:
Screening mammogram in one year. (Code:TN-[id number])

BI-RADS CATEGORY  1: Negative.

## 2016-10-02 ENCOUNTER — Ambulatory Visit (INDEPENDENT_AMBULATORY_CARE_PROVIDER_SITE_OTHER): Payer: 59 | Admitting: Obstetrics & Gynecology

## 2016-10-02 ENCOUNTER — Ambulatory Visit (INDEPENDENT_AMBULATORY_CARE_PROVIDER_SITE_OTHER): Payer: 59 | Admitting: Internal Medicine

## 2016-10-02 ENCOUNTER — Telehealth: Payer: Self-pay

## 2016-10-02 ENCOUNTER — Encounter: Payer: Self-pay | Admitting: Obstetrics & Gynecology

## 2016-10-02 ENCOUNTER — Encounter: Payer: Self-pay | Admitting: Internal Medicine

## 2016-10-02 VITALS — BP 174/119 | HR 86 | Wt 163.0 lb

## 2016-10-02 DIAGNOSIS — I10 Essential (primary) hypertension: Secondary | ICD-10-CM | POA: Diagnosis not present

## 2016-10-02 DIAGNOSIS — N926 Irregular menstruation, unspecified: Secondary | ICD-10-CM

## 2016-10-02 DIAGNOSIS — Z124 Encounter for screening for malignant neoplasm of cervix: Secondary | ICD-10-CM | POA: Diagnosis not present

## 2016-10-02 DIAGNOSIS — Z01419 Encounter for gynecological examination (general) (routine) without abnormal findings: Secondary | ICD-10-CM | POA: Diagnosis not present

## 2016-10-02 DIAGNOSIS — Z1151 Encounter for screening for human papillomavirus (HPV): Secondary | ICD-10-CM | POA: Diagnosis not present

## 2016-10-02 MED ORDER — LOSARTAN POTASSIUM-HCTZ 100-25 MG PO TABS: 1.0000 | ORAL_TABLET | Freq: Every day | ORAL | 3 refills | Status: DC

## 2016-10-02 MED ORDER — NORETHINDRONE ACETATE 5 MG PO TABS: 5.0000 mg | ORAL_TABLET | Freq: Every day | ORAL | 2 refills | Status: DC

## 2016-10-02 NOTE — Telephone Encounter (Signed)
Ellen [name] at [name] said pt is there and BP 174/120; pt is asymptomatic; no pain and no reason can find for elevated BP. Pt lives in [place] and request to be seen this morning. Dr Silvio Pate has appt 10/02/16 at 10:15. Pt was seen 09/14/16 and BP was 140/80. Pt has not missed any meds. FYI to Dr Silvio Pate.

## 2016-10-02 NOTE — Progress Notes (Signed)
Subjective:    Patient ID: Ellen [name], female    DOB: 03/20/1970, 46 y.o.   MRN: [id number]  HPI Here due to elevated BP  Just at gyn and BP was up quite a bit Doesn't think this is white coat hypertension  On HCTZ for 2 years  No chest pain in past month--- had costochondritis then Got meloxicam for this Took for 3 days and now rarely (not recently)  Current Outpatient Prescriptions on File Prior to Visit  Medication Sig Dispense Refill  . dicyclomine (BENTYL) 10 MG capsule Take 1 capsule (10 mg total) by mouth 3 (three) times daily before meals. 16 capsule 3  . FeFum-FePo-FA-B Cmp-C-Zn-Mn-Cu (SE-TAN PLUS) 162-115.2-1 MG CAPS TAKE 1 CAPSULE BY MOUTH EVERY DAY 90 capsule 2  . fluticasone (FLONASE) 50 MCG/ACT nasal spray Place 2 sprays into both nostrils daily. 16 g 11  . hydrochlorothiazide (HYDRODIURIL) 25 MG tablet TAKE 1 TABLET (25 MG TOTAL) BY MOUTH DAILY. 90 tablet 3  . ipratropium (ATROVENT) 0.06 % nasal spray Place 2 sprays into both nostrils 4 (four) times daily. (Patient taking differently: Place 2 sprays into both nostrils 2 (two) times daily as needed. ) 15 mL 1  . levocetirizine (XYZAL) 5 MG tablet Take 1 tablet (5 mg total) by mouth every evening. 90 tablet 0  . meloxicam (MOBIC) 15 MG tablet Take 1 tablet (15 mg total) by mouth daily. With food as needed for pain 30 tablet 0  . montelukast (SINGULAIR) 10 MG tablet Take 1 tablet (10 mg total) by mouth daily as needed. 90 tablet 3  . norethindrone (AYGESTIN) 5 MG tablet Take 1 tablet (5 mg total) by mouth daily. 30 tablet 2  . ondansetron (ZOFRAN-ODT) 8 MG disintegrating tablet Take 1 tablet (8 mg total) by mouth every 8 (eight) hours as needed for nausea or vomiting. 30 tablet 3   No current facility-administered medications on file prior to visit.     No Known Allergies  Past Medical History:  Diagnosis Date  . Allergic rhinitis due to pollen   . DM (diabetes mellitus), gestational   . Hepatitis B antibody  positive    questionable  . Hyperlipidemia   . Hypertension   . Migraine   . Seborrheic dermatitis     Past Surgical History:  Procedure Laterality Date  . CESAREAN SECTION     x 2   . hystosalpingogram    . PILONIDAL CYST EXCISION    . UTERINE FIBROID SURGERY      Family History  Problem Relation Age of Onset  . Liver cancer Mother   . Diabetes Father   . Heart disease Father   . Migraines Sister   . Migraines Sister   . Migraines Sister   . Colon cancer Neg Hx     Social History   Social History  . Marital status: Married    Spouse name: N/A  . Number of children: 2  . Years of education: N/A   Occupational History  . Account manager    Social History Main Topics  . Smoking status: Never Smoker  . Smokeless tobacco: Never Used  . Alcohol use No  . Drug use: No  . Sexual activity: Yes    Birth control/ protection: None   Other Topics Concern  . Not on file   Social History Narrative  . No narrative on file   Review of Systems  Has gained 20# in the past 2 years Not careful with eating recently  Has just gotten back into walking program     Objective:   Physical Exam  Constitutional: She appears well-nourished. No distress.  Cardiovascular: Normal rate, regular rhythm and normal heart sounds.  Exam reveals no gallop.   No murmur heard. Pulmonary/Chest: Effort normal and breath sounds normal. No respiratory distress. She has no wheezes. She has no rales.  Musculoskeletal: She exhibits no edema.          Assessment & Plan:

## 2016-10-02 NOTE — Telephone Encounter (Signed)
See OV note.  

## 2016-10-02 NOTE — Patient Instructions (Signed)
Preventive Care 40-64 Years, Female Preventive care refers to lifestyle choices and visits with your health care provider that can promote health and wellness. What does preventive care include?  A yearly physical exam. This is also called an annual well check.  Dental exams once or twice a year.  Routine eye exams. Ask your health care provider how often you should have your eyes checked.  Personal lifestyle choices, including: ? Daily care of your teeth and gums. ? Regular physical activity. ? Eating a healthy diet. ? Avoiding tobacco and drug use. ? Limiting alcohol use. ? Practicing safe sex. ? Taking low-dose aspirin daily starting at age 58. ? Taking vitamin and mineral supplements as recommended by your health care provider. What happens during an annual well check? The services and screenings done by your health care provider during your annual well check will depend on your age, overall health, lifestyle risk factors, and family history of disease. Counseling Your health care provider may ask you questions about your:  Alcohol use.  Tobacco use.  Drug use.  Emotional well-being.  Home and relationship well-being.  Sexual activity.  Eating habits.  Work and work Statistician.  Method of birth control.  Menstrual cycle.  Pregnancy history.  Screening You may have the following tests or measurements:  Height, weight, and BMI.  Blood pressure.  Lipid and cholesterol levels. These may be checked every 5 years, or more frequently if you are over 81 years old.  Skin check.  Lung cancer screening. You may have this screening every year starting at age 78 if you have a 30-pack-year history of smoking and currently smoke or have quit within the past 15 years.  Fecal occult blood test (FOBT) of the stool. You may have this test every year starting at age 65.  Flexible sigmoidoscopy or colonoscopy. You may have a sigmoidoscopy every 5 years or a colonoscopy  every 10 years starting at age 30.  Hepatitis C blood test.  Hepatitis B blood test.  Sexually transmitted disease (STD) testing.  Diabetes screening. This is done by checking your blood sugar (glucose) after you have not eaten for a while (fasting). You may have this done every 1-3 years.  Mammogram. This may be done every 1-2 years. Talk to your health care provider about when you should start having regular mammograms. This may depend on whether you have a family history of breast cancer.  BRCA-related cancer screening. This may be done if you have a family history of breast, ovarian, tubal, or peritoneal cancers.  Pelvic exam and Pap test. This may be done every 3 years starting at age 80. Starting at age 36, this may be done every 5 years if you have a Pap test in combination with an HPV test.  Bone density scan. This is done to screen for osteoporosis. You may have this scan if you are at high risk for osteoporosis.  Discuss your test results, treatment options, and if necessary, the need for more tests with your health care provider. Vaccines Your health care provider may recommend certain vaccines, such as:  Influenza vaccine. This is recommended every year.  Tetanus, diphtheria, and acellular pertussis (Tdap, Td) vaccine. You may need a Td booster every 10 years.  Varicella vaccine. You may need this if you have not been vaccinated.  Zoster vaccine. You may need this after age 5.  Measles, mumps, and rubella (MMR) vaccine. You may need at least one dose of MMR if you were born in  1957 or later. You may also need a second dose.  Pneumococcal 13-valent conjugate (PCV13) vaccine. You may need this if you have certain conditions and were not previously vaccinated.  Pneumococcal polysaccharide (PPSV23) vaccine. You may need one or two doses if you smoke cigarettes or if you have certain conditions.  Meningococcal vaccine. You may need this if you have certain  conditions.  Hepatitis A vaccine. You may need this if you have certain conditions or if you travel or work in places where you may be exposed to hepatitis A.  Hepatitis B vaccine. You may need this if you have certain conditions or if you travel or work in places where you may be exposed to hepatitis B.  Haemophilus influenzae type b (Hib) vaccine. You may need this if you have certain conditions.  Talk to your health care provider about which screenings and vaccines you need and how often you need them. This information is not intended to replace advice given to you by your health care provider. Make sure you discuss any questions you have with your health care provider. Document Released: 02/15/2015 Document Revised: 10/09/2015 Document Reviewed: 11/20/2014 Elsevier Interactive Patient Education  2017 [employer].

## 2016-10-02 NOTE — Patient Instructions (Addendum)
Please cut the first new medication in half and take 1/2 daily. If you have no problems with that, increase to a full tab. This takes the place of the HCTZ you are already on  [place] stands for "Dietary Approaches to Stop Hypertension." The DASH eating plan is a healthy eating plan that has been shown to reduce high blood pressure (hypertension). It may also reduce your risk for type 2 diabetes, heart disease, and stroke. The DASH eating plan may also help with weight loss. What are tips for following this plan? General guidelines  Avoid eating more than 2,300 mg (milligrams) of salt (sodium) a day. If you have hypertension, you may need to reduce your sodium intake to 1,500 mg a day.  Limit alcohol intake to no more than 1 drink a day for nonpregnant women and 2 drinks a day for men. One drink equals 12 oz of beer, 5 oz of wine, or 1 oz of hard liquor.  Work with your health care provider to maintain a healthy body weight or to lose weight. Ask what an ideal weight is for you.  Get at least 30 minutes of exercise that causes your heart to beat faster (aerobic exercise) most days of the week. Activities may include walking, swimming, or biking.  Work with your health care provider or diet and nutrition specialist (dietitian) to adjust your eating plan to your individual calorie needs. Reading food labels  Check food labels for the amount of sodium per serving. Choose foods with less than 5 percent of the Daily Value of sodium. Generally, foods with less than 300 mg of sodium per serving fit into this eating plan.  To find whole grains, look for the word "whole" as the first word in the ingredient list. Shopping  Buy products labeled as "low-sodium" or "no salt added."  Buy fresh foods. Avoid canned foods and premade or frozen meals. Cooking  Avoid adding salt when cooking. Use salt-free seasonings or herbs instead of table salt or sea salt. Check with your health care  provider or pharmacist before using salt substitutes.  Do not fry foods. Cook foods using healthy methods such as baking, boiling, grilling, and broiling instead.  Cook with heart-healthy oils, such as olive, canola, soybean, or sunflower oil. Meal planning   Eat a balanced diet that includes: ? 5 or more servings of fruits and vegetables each day. At each meal, try to fill half of your plate with fruits and vegetables. ? Up to 6-8 servings of whole grains each day. ? Less than 6 oz of lean meat, poultry, or fish each day. A 3-oz serving of meat is about the same size as a deck of cards. One egg equals 1 oz. ? 2 servings of low-fat dairy each day. ? A serving of nuts, seeds, or beans 5 times each week. ? Heart-healthy fats. Healthy fats called Omega-3 fatty acids are found in foods such as flaxseeds and coldwater fish, like sardines, salmon, and mackerel.  Limit how much you eat of the following: ? Canned or prepackaged foods. ? Food that is high in trans fat, such as fried foods. ? Food that is high in saturated fat, such as fatty meat. ? Sweets, desserts, sugary drinks, and other foods with added sugar. ? Full-fat dairy products.  Do not salt foods before eating.  Try to eat at least 2 vegetarian meals each week.  Eat more home-cooked food and less restaurant, buffet, and fast food.  When eating  at a restaurant, ask that your food be prepared with less salt or no salt, if possible. What foods are recommended? The items listed may not be a complete list. Talk with your dietitian about what dietary choices are best for you. Grains Whole-grain or whole-wheat bread. Whole-grain or whole-wheat pasta. Brown rice. [place]. Bulgur. Whole-grain and low-sodium cereals. Pita bread. Low-fat, low-sodium crackers. Whole-wheat flour tortillas. Vegetables Fresh or frozen vegetables (raw, steamed, roasted, or grilled). Low-sodium or reduced-sodium tomato and vegetable juice. Low-sodium or  reduced-sodium tomato sauce and tomato paste. Low-sodium or reduced-sodium canned vegetables. Fruits All fresh, dried, or frozen fruit. Canned fruit in natural juice (without added sugar). Meat and other protein foods Skinless chicken or [place]. Ground chicken or [place]. Pork with fat trimmed off. Fish and seafood. Egg whites. Dried beans, peas, or lentils. Unsalted nuts, nut butters, and seeds. Unsalted canned beans. Lean cuts of beef with fat trimmed off. Low-sodium, lean deli meat. Dairy Low-fat (1%) or fat-free (skim) milk. Fat-free, low-fat, or reduced-fat cheeses. Nonfat, low-sodium ricotta or cottage cheese. Low-fat or nonfat yogurt. Low-fat, low-sodium cheese. Fats and oils Soft margarine without trans fats. Vegetable oil. Low-fat, reduced-fat, or light mayonnaise and salad dressings (reduced-sodium). Canola, safflower, olive, soybean, and sunflower oils. Avocado. Seasoning and other foods Herbs. Spices. Seasoning mixes without salt. Unsalted popcorn and pretzels. Fat-free sweets. What foods are not recommended? The items listed may not be a complete list. Talk with your dietitian about what dietary choices are best for you. Grains Baked goods made with fat, such as croissants, muffins, or some breads. Dry pasta or rice meal packs. Vegetables Creamed or fried vegetables. Vegetables in a cheese sauce. Regular canned vegetables (not low-sodium or reduced-sodium). Regular canned tomato sauce and paste (not low-sodium or reduced-sodium). Regular tomato and vegetable juice (not low-sodium or reduced-sodium). Angie Fava. Olives. Fruits Canned fruit in a light or heavy syrup. Fried fruit. Fruit in cream or butter sauce. Meat and other protein foods Fatty cuts of meat. Ribs. Fried meat. Berniece Salines. Sausage. Bologna and other processed lunch meats. Salami. Fatback. Hotdogs. Bratwurst. Salted nuts and seeds. Canned beans with added salt. Canned or smoked fish. Whole eggs or egg yolks. Chicken or [place]  with skin. Dairy Whole or 2% milk, cream, and half-and-half. Whole or full-fat cream cheese. Whole-fat or sweetened yogurt. Full-fat cheese. Nondairy creamers. Whipped toppings. Processed cheese and cheese spreads. Fats and oils Butter. Stick margarine. Lard. Shortening. Ghee. Bacon fat. Tropical oils, such as coconut, palm kernel, or palm oil. Seasoning and other foods Salted popcorn and pretzels. Onion salt, garlic salt, seasoned salt, table salt, and sea salt. Worcestershire sauce. Tartar sauce. Barbecue sauce. Teriyaki sauce. Soy sauce, including reduced-sodium. Steak sauce. Canned and packaged gravies. Fish sauce. Oyster sauce. Cocktail sauce. Horseradish that you find on the shelf. Ketchup. Mustard. Meat flavorings and tenderizers. Bouillon cubes. Hot sauce and Tabasco sauce. Premade or packaged marinades. Premade or packaged taco seasonings. Relishes. Regular salad dressings. Where to find more information:  National Heart, Lung, and [place]: https://wilson-eaton.com/  American Heart Association: www.heart.org Summary  The DASH eating plan is a healthy eating plan that has been shown to reduce high blood pressure (hypertension). It may also reduce your risk for type 2 diabetes, heart disease, and stroke.  With the DASH eating plan, you should limit salt (sodium) intake to 2,300 mg a day. If you have hypertension, you may need to reduce your sodium intake to 1,500 mg a day.  When on the DASH eating plan, aim to  eat more fresh fruits and vegetables, whole grains, lean proteins, low-fat dairy, and heart-healthy fats.  Work with your health care provider or diet and nutrition specialist (dietitian) to adjust your eating plan to your individual calorie needs. This information is not intended to replace advice given to you by your health care provider. Make sure you discuss any questions you have with your health care provider. Document Released: 01/08/2011 Document Revised: 01/13/2016  Document Reviewed: 01/13/2016 Elsevier Interactive Patient Education  2017 [employer].

## 2016-10-02 NOTE — Progress Notes (Signed)
GYNECOLOGY ANNUAL PREVENTATIVE CARE ENCOUNTER NOTE  Subjective:   Ellen [name] is a 46 y.o. [id number] female here for a routine annual gynecologic exam.  Current complaints: a lot of pelvic discomfort, irritable bowel symptoms and not feeling well during menses.  Interested in menstrual suppression with pills; has considered hysterectomy.  No irregular or heavy menstrual bleeding.  Denies abnormal vaginal bleeding, discharge, pelvic pain, problems with intercourse or other gynecologic concerns.    Gynecologic History Patient's last menstrual period was 09/26/2016. Contraception: none Last Pap: 08/08/2015. Results were: normal with negative HPV. Last mammogram: 07/15/2016. Results were: normal  Obstetric History OB History  Gravida Para Term Preterm AB Living  2 2 2     2   SAB TAB Ectopic Multiple Live Births          2    # Outcome Date GA Lbr Len/2nd Weight Sex Delivery Anes PTL Lv  2 Term 09/15/07 [redacted]w[redacted]d  7 lb 13 oz (3.544 kg) M CS-LTranv   LIV     Complications: GDM (gestational diabetes mellitus)  1 Term 08/21/05 [redacted]w[redacted]d  7 lb 11 oz (3.487 kg) M CS-LTranv   LIV     Complications: Failure to Progress in First Stage,GDM (gestational diabetes mellitus)      Past Medical History:  Diagnosis Date  . Allergic rhinitis due to pollen   . DM (diabetes mellitus), gestational   . 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 Hyperlipidemia   . Hypertension   . Migraine   . Seborrheic dermatitis     Past Surgical History:  Procedure Laterality Date  . CESAREAN SECTION     x 2   . hystosalpingogram    . PILONIDAL CYST EXCISION    . UTERINE FIBROID SURGERY      Current Outpatient Prescriptions on File Prior to Visit  Medication Sig Dispense Refill  . dicyclomine (BENTYL) 10 MG capsule Take 1 capsule (10 mg total) by mouth 3 (three) times daily before meals. 16 capsule 3  . FeFum-FePo-FA-B Cmp-C-Zn-Mn-Cu (SE-TAN PLUS) 162-115.2-1 MG CAPS TAKE 1 CAPSULE BY MOUTH EVERY DAY  90 capsule 2  . fluticasone (FLONASE) 50 MCG/ACT nasal spray Place 2 sprays into both nostrils daily. 16 g 11  . hydrochlorothiazide (HYDRODIURIL) 25 MG tablet TAKE 1 TABLET (25 MG TOTAL) BY MOUTH DAILY. 90 tablet 3  . ipratropium (ATROVENT) 0.06 % nasal spray Place 2 sprays into both nostrils 4 (four) times daily. (Patient taking differently: Place 2 sprays into both nostrils 2 (two) times daily as needed. ) 15 mL 1  . levocetirizine (XYZAL) 5 MG tablet Take 1 tablet (5 mg total) by mouth every evening. 90 tablet 0  . meloxicam (MOBIC) 15 MG tablet Take 1 tablet (15 mg total) by mouth daily. With food as needed for pain 30 tablet 0  . montelukast (SINGULAIR) 10 MG tablet Take 1 tablet (10 mg total) by mouth daily as needed. 90 tablet 3  . ondansetron (ZOFRAN-ODT) 8 MG disintegrating tablet Take 1 tablet (8 mg total) by mouth every 8 (eight) hours as needed for nausea or vomiting. 30 tablet 3  . Prenatal Vit-Fe Fumarate-FA (PRENATAL MULTIVITAMIN) TABS tablet Take 1 tablet by mouth daily at 12 noon.     No current facility-administered medications on file prior to visit.     No Known Allergies  Social History   Social History  . Marital status: Married    Spouse name: N/A  . Number of children: 2  .  Years of education: N/A   Occupational History  . Account manager    Social History Main Topics  . Smoking status: Never Smoker  . Smokeless tobacco: Never Used  . Alcohol use No  . Drug use: No  . Sexual activity: Yes    Birth control/ protection: None   Other Topics Concern  . Not on file   Social History Narrative  . No narrative on file    Family History  Problem Relation Age of Onset  . Liver cancer Mother   . Diabetes Father   . Heart disease Father   . Migraines Sister   . Migraines Sister   . Migraines Sister   . Colon cancer Neg Hx     The following portions of the patient's history were reviewed and updated as appropriate: allergies, current medications, past  family history, past medical history, past social history, past surgical history and problem list.  Review of Systems Pertinent items noted in HPI and remainder of comprehensive ROS otherwise negative.   Objective:  BP (!) 168/123   Pulse 86   Wt 163 lb (73.9 kg)   LMP 09/26/2016   BMI 27.98 kg/m   BP recheck at end of visit 174/119 CONSTITUTIONAL: Well-developed, well-nourished female in no acute distress.  HENT:  Normocephalic, atraumatic, External right and left ear normal. Oropharynx is clear and moist EYES: Conjunctivae and EOM are normal. Pupils are equal, round, and reactive to light. No scleral icterus.  NECK: Normal range of motion, supple, no masses.  Normal thyroid.  SKIN: Skin is warm and dry. No rash noted. Not diaphoretic. No erythema. No pallor. NEUROLOGIC: Alert and oriented to person, place, and time. Normal reflexes, muscle tone coordination. No cranial nerve deficit noted. PSYCHIATRIC: Normal mood and affect. Normal behavior. Normal judgment and thought content. CARDIOVASCULAR: Normal heart rate noted, regular rhythm RESPIRATORY: Clear to auscultation bilaterally. Effort and breath sounds normal, no problems with respiration noted. BREASTS: Symmetric in size. No masses, skin changes, nipple drainage, or lymphadenopathy. ABDOMEN: Soft, normal bowel sounds, no distention noted.  No tenderness, rebound or guarding.  PELVIC: Normal appearing external genitalia; normal appearing vaginal mucosa and cervix.  No abnormal discharge noted.  Pap smear obtained.  Enlarged fibroid uterus, no other palpable masses, no uterine or adnexal tenderness. MUSCULOSKELETAL: Normal range of motion. No tenderness.  No cyanosis, clubbing, or edema.  2+ distal pulses.   Assessment and Plan:  1. Encounter for gynecological examination with Papanicolaou smear of cervix Will follow up results of pap smear and manage accordingly. - Cytology - PAP  2. Menstrual cycle disorder Discussed menstrual  suppression, she is not a candidate for combined OCPs given very elevated BP. Will try progestin only therapy with Norethindrone, this was prescribed. Mya increase dosage to 10 mg daily if needed.  Will monitor response. - norethindrone (AYGESTIN) 5 MG tablet; Take 1 tablet (5 mg total) by mouth daily.  Dispense: 30 tablet; Refill: 2  3. Essential hypertension BP Readings from Last 3 Encounters:  10/02/16 (!) 168/123 and 174/119  09/14/16 140/80  08/21/16 (!) 150/110  No headaches, visual changes.  Patient immediately sent from here to go to PCP office across the street to discuss adjusting medication; currently just on HCTZ 25 mg daily.   Routine preventative health maintenance measures emphasized. Please refer to After Visit Summary for other counseling recommendations.    Verita Schneiders, MD, [place] Attending Obstetrician & Gynecologist, [place] for [facility]  [facility]care

## 2016-10-02 NOTE — Assessment & Plan Note (Signed)
BP Readings from Last 3 Encounters:  10/02/16 (!) 162/102  10/02/16 (!) 174/119  09/14/16 140/80   Persistent elevations Will add losartan to the HCTZ Should consider CCB if stays up

## 2016-10-04 ENCOUNTER — Other Ambulatory Visit: Payer: Self-pay | Admitting: Family Medicine

## 2016-10-06 NOTE — Telephone Encounter (Signed)
Please refill times 5 

## 2016-10-06 NOTE — Telephone Encounter (Signed)
done

## 2016-10-06 NOTE — Telephone Encounter (Signed)
F/u scheduled on 11/03/16, last filled on 09/08/16 #30 tabs with 0 refills, please advise

## 2016-10-08 ENCOUNTER — Encounter: Payer: Self-pay | Admitting: Obstetrics & Gynecology

## 2016-10-09 LAB — CYTOLOGY - PAP
DIAGNOSIS: NEGATIVE
HPV (WINDOPATH): NOT DETECTED

## 2016-11-03 ENCOUNTER — Ambulatory Visit: Payer: 59 | Admitting: Family Medicine

## 2016-11-24 ENCOUNTER — Ambulatory Visit: Payer: 59 | Admitting: Obstetrics & Gynecology

## 2016-11-24 ENCOUNTER — Ambulatory Visit (INDEPENDENT_AMBULATORY_CARE_PROVIDER_SITE_OTHER): Payer: 59 | Admitting: Obstetrics & Gynecology

## 2016-11-24 DIAGNOSIS — N921 Excessive and frequent menstruation with irregular cycle: Secondary | ICD-10-CM | POA: Diagnosis not present

## 2016-11-24 MED ORDER — NORETHINDRONE ACETATE 5 MG PO TABS: 10.0000 mg | ORAL_TABLET | Freq: Every day | ORAL | 4 refills | Status: DC

## 2016-11-24 NOTE — Progress Notes (Signed)
Pt complains of having spotting that started 10/9 and on 10/20 bleeding got heavier. Denies cramping.

## 2016-11-24 NOTE — Patient Instructions (Signed)
Return to clinic for any scheduled appointments or for any gynecologic concerns as needed.   

## 2016-11-24 NOTE — Progress Notes (Signed)
   GYNECOLOGY OFFICE VISIT NOTE  History:  46 y.o. [id number] here today for follow up AUB. Was started on Aygestin 5 mg daily. Reports improved bleeding profile, had some prolonged light bleeding since 10/10 after 40 days of therapy.  She denies any current abnormal vaginal discharge, bleeding, pelvic pain or other concerns.   Past Medical History:  Diagnosis Date  . Allergic rhinitis due to pollen   . DM (diabetes mellitus), gestational   . 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 Hyperlipidemia   . Hypertension   . Migraine   . Seborrheic dermatitis     Past Surgical History:  Procedure Laterality Date  . CESAREAN SECTION     x 2   . hystosalpingogram    . PILONIDAL CYST EXCISION    . UTERINE FIBROID SURGERY      The following portions of the patient's history were reviewed and updated as appropriate: allergies, current medications, past family history, past medical history, past social history, past surgical history and problem list.   Health Maintenance:  Normal pap and negative HRHPV on 10/02/2016.   Review of Systems:  Pertinent items noted in HPI and remainder of comprehensive ROS otherwise negative.   Objective:  Physical Exam BP (!) 150/106   Pulse (!) 101   Ht 5\' 4"  (1.626 m)   Wt 162 lb 3.2 oz (73.6 kg)   LMP 11/10/2016   BMI 27.84 kg/m  CONSTITUTIONAL: Well-developed, well-nourished female in no acute distress.  HENT:  Normocephalic, atraumatic. External right and left ear normal. Oropharynx is clear and moist EYES: Conjunctivae and EOM are normal. Pupils are equal, round, and reactive to light. No scleral icterus.  NECK: Normal range of motion, supple, no masses SKIN: Skin is warm and dry. No rash noted. Not diaphoretic. No erythema. No pallor. NEUROLOGIC: Alert and oriented to person, place, and time. Normal reflexes, muscle tone coordination. No cranial nerve deficit noted. PSYCHIATRIC: Normal mood and affect. Normal behavior. Normal judgment and  thought content. CARDIOVASCULAR: Normal heart rate noted RESPIRATORY: Effort and breath sounds normal, no problems with respiration noted ABDOMEN: Soft, no distention noted.   PELVIC: Deferred MUSCULOSKELETAL: Normal range of motion. No edema noted.  Labs and Imaging No results found.  Assessment & Plan:  1. Breakthrough bleeding on progestin pills Will increase dosage to 10 mg and monitor effect. No estrogen given HTN for now, will follow up with PCP.  - norethindrone (AYGESTIN) 5 MG tablet; Take 2 tablets (10 mg total) by mouth daily.  Dispense: 60 tablet; Refill: 4 Routine preventative health maintenance measures emphasized. Please refer to After Visit Summary for other counseling recommendations.   Return if symptoms worsen or fail to improve.   Total face-to-face time with patient: 15 minutes. Over 50% of encounter was spent on counseling and coordination of care.   Verita Schneiders, MD, [place] Attending [place], [facility] for [employer], [place]

## 2016-12-16 ENCOUNTER — Other Ambulatory Visit: Payer: Self-pay

## 2016-12-16 NOTE — Telephone Encounter (Signed)
Patient contacted her pharmacy and her blood pressure medicine was not included on the recall list.

## 2016-12-16 NOTE — Telephone Encounter (Signed)
Copied from [place] #[id number]. Topic: Quick Communication - See Telephone Encounter >> Dec 16, 2016  1:09 PM Lennox Solders wrote: CRM for notification. See Telephone encounter for: pt has concerning about losartan  12/16/16. >> Dec 16, 2016  1:24 PM Helene Shoe, LPN wrote: I spoke with pt and she will ck with her pharmacy to see if the losartan pt takes is in the lot # and manufacturer that is on recall. Pt will cb if needed. FYI to Dr Glori Bickers.

## 2016-12-28 ENCOUNTER — Other Ambulatory Visit: Payer: Self-pay | Admitting: Family Medicine

## 2016-12-30 ENCOUNTER — Ambulatory Visit (INDEPENDENT_AMBULATORY_CARE_PROVIDER_SITE_OTHER): Payer: 59 | Admitting: Family Medicine

## 2016-12-30 ENCOUNTER — Encounter: Payer: Self-pay | Admitting: Family Medicine

## 2016-12-30 VITALS — BP 160/98 | HR 92 | Temp 98.1°F | Ht 64.0 in | Wt 161.8 lb

## 2016-12-30 DIAGNOSIS — R739 Hyperglycemia, unspecified: Secondary | ICD-10-CM

## 2016-12-30 DIAGNOSIS — E7800 Pure hypercholesterolemia, unspecified: Secondary | ICD-10-CM

## 2016-12-30 DIAGNOSIS — E559 Vitamin D deficiency, unspecified: Secondary | ICD-10-CM

## 2016-12-30 DIAGNOSIS — I10 Essential (primary) hypertension: Secondary | ICD-10-CM | POA: Diagnosis not present

## 2016-12-30 LAB — CBC WITH DIFFERENTIAL/PLATELET
BASOS ABS: 0 10*3/uL (ref 0.0–0.1)
Basophils Relative: 0.6 % (ref 0.0–3.0)
EOS ABS: 0.2 10*3/uL (ref 0.0–0.7)
Eosinophils Relative: 2.6 % (ref 0.0–5.0)
HCT: 43.1 % (ref 36.0–46.0)
Hemoglobin: 13.7 g/dL (ref 12.0–15.0)
LYMPHS ABS: 2.1 10*3/uL (ref 0.7–4.0)
Lymphocytes Relative: 31.8 % (ref 12.0–46.0)
MCHC: 31.7 g/dL (ref 30.0–36.0)
MCV: 80.6 fl (ref 78.0–100.0)
MONO ABS: 0.5 10*3/uL (ref 0.1–1.0)
Monocytes Relative: 7 % (ref 3.0–12.0)
NEUTROS ABS: 3.8 10*3/uL (ref 1.4–7.7)
NEUTROS PCT: 58 % (ref 43.0–77.0)
PLATELETS: 277 10*3/uL (ref 150.0–400.0)
RBC: 5.35 Mil/uL — ABNORMAL HIGH (ref 3.87–5.11)
RDW: 16.5 % — ABNORMAL HIGH (ref 11.5–15.5)
WBC: 6.5 10*3/uL (ref 4.0–10.5)

## 2016-12-30 LAB — COMPREHENSIVE METABOLIC PANEL
ALBUMIN: 4.7 g/dL (ref 3.5–5.2)
ALT: 12 U/L (ref 0–35)
AST: 16 U/L (ref 0–37)
Alkaline Phosphatase: 45 U/L (ref 39–117)
BUN: 15 mg/dL (ref 6–23)
CALCIUM: 9.9 mg/dL (ref 8.4–10.5)
CHLORIDE: 97 meq/L (ref 96–112)
CO2: 28 mEq/L (ref 19–32)
Creatinine, Ser: 1.08 mg/dL (ref 0.40–1.20)
GFR: 69.68 mL/min (ref >60.00)
Glucose, Bld: 143 mg/dL — ABNORMAL HIGH (ref 70–99)
POTASSIUM: 3.6 meq/L (ref 3.5–5.1)
SODIUM: 134 meq/L — AB (ref 135–145)
Total Bilirubin: 0.7 mg/dL (ref 0.2–1.2)
Total Protein: 8.4 g/dL — ABNORMAL HIGH (ref 6.0–8.3)

## 2016-12-30 LAB — LIPID PANEL
CHOLESTEROL: 217 mg/dL — AB (ref 0–200)
HDL: 37.2 mg/dL — ABNORMAL LOW (ref >39.00)
LDL Cholesterol: 164 mg/dL — ABNORMAL HIGH (ref 0–99)
NonHDL: 179.52
TRIGLYCERIDES: 80 mg/dL (ref 0.0–149.0)
Total CHOL/HDL Ratio: 6
VLDL: 16 mg/dL (ref 0.0–40.0)

## 2016-12-30 LAB — HEMOGLOBIN A1C: Hgb A1c MFr Bld: 8.5 % — ABNORMAL HIGH (ref 4.6–6.5)

## 2016-12-30 LAB — VITAMIN D 25 HYDROXY (VIT D DEFICIENCY, FRACTURES): VITD: 45.08 ng/mL (ref 30.00–100.00)

## 2016-12-30 LAB — TSH: TSH: 1.18 u[IU]/mL (ref 0.35–4.50)

## 2016-12-30 MED ORDER — AMLODIPINE BESYLATE 5 MG PO TABS: 5.0000 mg | ORAL_TABLET | Freq: Every day | ORAL | 11 refills | Status: DC

## 2016-12-30 NOTE — Progress Notes (Signed)
Subjective:    Patient ID: Ellen [name], female    DOB: 1971-01-27, 46 y.o.   MRN: [id number]  HPI Here for f/u of HTN   bp is high today  No cp or palpitations or headaches or edema  No side effects to medicines  BP Readings from Last 3 Encounters:  12/30/16 (!) 160/98  11/24/16 (!) 150/106  10/02/16 (!) 162/102     Saw dr Silvio Pate end of aug and he added losartan Now on losartan hct 100-25  On norethindrone from gyn for bleeding  It is really helping Her gyn said this will not inc bp   Eating better Walking for exercise  Has been high since last visit   Wt Readings from Last 3 Encounters:  12/30/16 161 lb 12 oz (73.4 kg)  11/24/16 162 lb 3.2 oz (73.6 kg)  10/02/16 163 lb (73.9 kg)   27.76 kg/m  Pulse Readings from Last 3 Encounters:  12/30/16 92  11/24/16 (!) 101  10/02/16 82       Chemistry      Component Value Date/Time   NA 134 (L) 08/14/2015 0942   K 4.0 08/14/2015 0942   CL 99 08/14/2015 0942   CO2 28 08/14/2015 0942   BUN 16 08/14/2015 0942   CREATININE 0.92 08/14/2015 0942      Component Value Date/Time   CALCIUM 9.8 08/14/2015 0942   CALCIUM 8.8 03/25/2009 2208   ALKPHOS 56 07/22/2015 1045   AST 14 07/22/2015 1045   ALT 9 07/22/2015 1045   BILITOT 0.5 07/22/2015 1045     Lab Results  Component Value Date   WBC 6.5 08/14/2015   HGB 13.4 08/14/2015   HCT 41.7 08/14/2015   MCV 78.0 08/14/2015   PLT 228.0 08/14/2015    Needs labs today   Last lipid Lab Results  Component Value Date   CHOL 210 (H) 07/22/2015   HDL 67.40 07/22/2015   LDLCALC 132 (H) 07/22/2015   LDLDIRECT 125.4 03/18/2010   TRIG 55.0 07/22/2015   CHOLHDL 3 07/22/2015   Working on low sat/trans fat diet   Hyperglycemia Lab Results  Component Value Date   HGBA1C 6.0 07/22/2015   Needs a re check  Doing well with diet- less carbs    Patient Active Problem List   Diagnosis Date Noted  . Vitamin D deficiency 03/29/2016  . Nausea without vomiting  12/17/2015  . Fibroids 05/27/2015  . Menses painful 05/27/2015  . Irregular menses 12/04/2014  . Essential hypertension 07/16/2014  . Routine general medical examination at a health care facility 03/20/2014  . Chest wall pain 12/09/2012  . Anemia, iron deficiency 05/04/2012  . Elevated blood pressure (not hypertension) 05/04/2012  . Seborrheic dermatitis 09/25/2011  . GANGLION CYST, WRIST, LEFT 03/03/2010  . COMMON MIGRAINE 06/22/2008  . Hyperglycemia 02/15/2008  . DIABETES MELLITUS, GESTATIONAL, HX OF 02/15/2008  . Hyperlipidemia 10/01/2006  . Sinusitis, chronic 10/01/2006  . ALLERGIC RHINITIS 10/01/2006  . ALOPECIA 10/01/2006   Past Medical History:  Diagnosis Date  . Allergic rhinitis due to pollen   . DM (diabetes mellitus), gestational   . 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 Hyperlipidemia   . Hypertension   . Migraine   . Seborrheic dermatitis    Past Surgical History:  Procedure Laterality Date  . CESAREAN SECTION     x 2   . hystosalpingogram    . PILONIDAL CYST EXCISION    . UTERINE FIBROID SURGERY  Social History   Tobacco Use  . Smoking status: Never Smoker  . Smokeless tobacco: Never Used  Substance Use Topics  . Alcohol use: No    Alcohol/week: 0.0 oz  . Drug use: No   Family History  Problem Relation Age of Onset  . Liver cancer Mother   . Diabetes Father   . Heart disease Father   . Migraines Sister   . Migraines Sister   . Migraines Sister   . Colon cancer Neg Hx    No Known Allergies Current Outpatient Medications on File Prior to Visit  Medication Sig Dispense Refill  . FeFum-FePo-FA-B Cmp-C-Zn-Mn-Cu (SE-TAN PLUS) 162-115.2-1 MG CAPS TAKE 1 CAPSULE BY MOUTH EVERY DAY 90 capsule 2  . fluticasone (FLONASE) 50 MCG/ACT nasal spray Place 2 sprays into both nostrils daily. 16 g 11  . ipratropium (ATROVENT) 0.06 % nasal spray Place 2 sprays into both nostrils 4 (four) times daily. (Patient taking differently: Place 2 sprays  into both nostrils 2 (two) times daily as needed. ) 15 mL 1  . levocetirizine (XYZAL) 5 MG tablet TAKE 1 TABLET (5 MG TOTAL) BY MOUTH EVERY EVENING. 90 tablet 0  . losartan-hydrochlorothiazide (HYZAAR) 100-25 MG tablet Take 1 tablet by mouth daily. 90 tablet 3  . montelukast (SINGULAIR) 10 MG tablet Take 1 tablet (10 mg total) by mouth daily as needed. 90 tablet 3  . norethindrone (AYGESTIN) 5 MG tablet Take 2 tablets (10 mg total) by mouth daily. 60 tablet 4  . ondansetron (ZOFRAN-ODT) 8 MG disintegrating tablet Take 1 tablet (8 mg total) by mouth every 8 (eight) hours as needed for nausea or vomiting. 30 tablet 3   No current facility-administered medications on file prior to visit.     Review of Systems  Constitutional: Negative for activity change, appetite change, fatigue, fever and unexpected weight change.  HENT: Negative for congestion, ear pain, rhinorrhea, sinus pressure and sore throat.   Eyes: Negative for pain, redness and visual disturbance.  Respiratory: Negative for cough, shortness of breath and wheezing.   Cardiovascular: Negative for chest pain and palpitations.  Gastrointestinal: Negative for abdominal pain, blood in stool, constipation and diarrhea.  Endocrine: Negative for polydipsia and polyuria.  Genitourinary: Negative for dysuria, frequency and urgency.  Musculoskeletal: Negative for arthralgias, back pain and myalgias.  Skin: Negative for pallor and rash.  Allergic/Immunologic: Negative for environmental allergies.  Neurological: Negative for dizziness, syncope and headaches.  Hematological: Negative for adenopathy. Does not bruise/bleed easily.  Psychiatric/Behavioral: Negative for decreased concentration and dysphoric mood. The patient is not nervous/anxious.        Objective:   Physical Exam  Constitutional: She appears well-developed and well-nourished. No distress.  overwt and well appearing   HENT:  Head: Normocephalic and atraumatic.  Mouth/Throat:  Oropharynx is clear and moist.  Eyes: Conjunctivae and EOM are normal. Pupils are equal, round, and reactive to light.  Neck: Normal range of motion. Neck supple. No JVD present. Carotid bruit is not present. No thyromegaly present.  Cardiovascular: Normal rate, regular rhythm, normal heart sounds and intact distal pulses. Exam reveals no gallop.  Pulmonary/Chest: Effort normal and breath sounds normal. No respiratory distress. She has no wheezes. She has no rales.  No crackles  Abdominal: Soft. Bowel sounds are normal. She exhibits no distension, no abdominal bruit and no mass. There is no tenderness.  Musculoskeletal: She exhibits no edema.  Lymphadenopathy:    She has no cervical adenopathy.  Neurological: She is alert. She has normal reflexes.  No cranial nerve deficit. She exhibits normal muscle tone. Coordination normal.  Skin: Skin is warm and dry. No rash noted.  Psychiatric: She has a normal mood and affect.          Assessment & Plan:   Problem List Items Addressed This Visit      Cardiovascular and Mediastinum   Essential hypertension - Primary    Not controlled  ? If progestin plays a role at all?  Will continue losartan hct and add amlodipine 5 mg daily  Alert if side effects or problems  Will check bp at drugstore (she lives in [place] now)  Continue good health habits F/u end of dec  Lab today      Relevant Medications   amLODipine (NORVASC) 5 MG tablet     Other   Hyperglycemia    Lab Results  Component Value Date   HGBA1C 6.0 07/22/2015   Lab today  Working on diet and exercise       Hyperlipidemia    Lab today      Relevant Medications   amLODipine (NORVASC) 5 MG tablet   Vitamin D deficiency

## 2016-12-30 NOTE — Telephone Encounter (Signed)
Copied from [facility] [phone]. Topic: Quick Communication - See Telephone Encounter >> Dec 30, 2016  3:50 PM Vernona Rieger wrote: CRM for notification. See Telephone encounter for:  Pt would like to know if Dr Glori Bickers will call her in a script for the Vitamin D Deficiency that she has. She wants it to be the gummi bears, because they do not make her nauseous. She needs a script because she wants to use her HSA card, and she has to have an actual script per pharmacy. Pharmacy is [facility] on [address] in [place]   12/30/16.

## 2016-12-30 NOTE — Patient Instructions (Addendum)
Continue the losartan hct as you have been taking it   Add amlodipine 5 mg daily  Keep exercising Avoid excess sodium   Labs today   Follow up at end of December

## 2016-12-30 NOTE — Assessment & Plan Note (Signed)
Lab today.

## 2016-12-30 NOTE — Assessment & Plan Note (Signed)
Lab Results  Component Value Date   HGBA1C 6.0 07/22/2015   Lab today  Working on diet and exercise

## 2016-12-30 NOTE — Telephone Encounter (Signed)
How many units is the dosage for her vit D gummies?

## 2016-12-30 NOTE — Assessment & Plan Note (Signed)
Not controlled  ? If progestin plays a role at all?  Will continue losartan hct and add amlodipine 5 mg daily  Alert if side effects or problems  Will check bp at drugstore (she lives in [place] now)  Continue good health habits F/u end of dec  Lab today

## 2016-12-30 NOTE — Telephone Encounter (Signed)
Rx request for Vit D- chewable gummies

## 2016-12-31 MED ORDER — CHOLECALCIFEROL 25 MCG (1000 UT) PO CHEW: 2.0000 | CHEWABLE_TABLET | Freq: Every day | ORAL | 3 refills | Status: DC

## 2016-12-31 NOTE — Telephone Encounter (Signed)
Phone in Rx but I did advise them they can dispense the 2000 iu since that's what they told me they have. Pt also notified Rx called in

## 2016-12-31 NOTE — Telephone Encounter (Signed)
In epic I could only find the 1000 iu so I wrote for 2 daily - but you can change that and call in the 2000 iu for 1 daily if they have it   Thanks  I pended the med

## 2016-12-31 NOTE — Telephone Encounter (Signed)
Called pharmacy and the only strength gummies they have for Rx, would be vitamin D3 2000iu

## 2017-01-01 ENCOUNTER — Other Ambulatory Visit: Payer: Self-pay | Admitting: Family Medicine

## 2017-01-06 ENCOUNTER — Encounter: Payer: Self-pay | Admitting: Family Medicine

## 2017-02-15 ENCOUNTER — Other Ambulatory Visit: Payer: Self-pay | Admitting: Family Medicine

## 2017-02-15 ENCOUNTER — Encounter: Payer: Self-pay | Admitting: Family Medicine

## 2017-02-15 MED ORDER — LEVOCETIRIZINE DIHYDROCHLORIDE 5 MG PO TABS: 5.0000 mg | ORAL_TABLET | Freq: Every evening | ORAL | 1 refills | Status: DC

## 2017-02-18 ENCOUNTER — Other Ambulatory Visit: Payer: Self-pay | Admitting: *Deleted

## 2017-02-18 DIAGNOSIS — N921 Excessive and frequent menstruation with irregular cycle: Secondary | ICD-10-CM

## 2017-02-18 MED ORDER — NORETHINDRONE ACETATE 5 MG PO TABS: 10.0000 mg | ORAL_TABLET | Freq: Every day | ORAL | 4 refills | Status: DC

## 2017-02-22 ENCOUNTER — Other Ambulatory Visit: Payer: Self-pay | Admitting: *Deleted

## 2017-03-31 ENCOUNTER — Telehealth: Payer: Self-pay | Admitting: *Deleted

## 2017-03-31 MED ORDER — LOSARTAN POTASSIUM 100 MG PO TABS: 100.0000 mg | ORAL_TABLET | Freq: Every day | ORAL | 3 refills | Status: DC

## 2017-03-31 MED ORDER — HYDROCHLOROTHIAZIDE 25 MG PO TABS: 25.0000 mg | ORAL_TABLET | Freq: Every day | ORAL | 3 refills | Status: DC

## 2017-03-31 NOTE — Telephone Encounter (Signed)
Received fax saying that pt's losartan-hctz is on back order. Dr. Glori Bickers can either send in 2 separate Rxs for the losartan and the HCTZ or the alt the pharmacy recommended is the [facility]. Either way the pharmacy needs a new Rx for pt.

## 2017-03-31 NOTE — Telephone Encounter (Signed)
I sent the individual losartan and hct to her pharmacy  Please let her know

## 2017-03-31 NOTE — Telephone Encounter (Signed)
Left VM letting pt know why we sent in medication separated

## 2017-07-06 ENCOUNTER — Other Ambulatory Visit: Payer: Self-pay | Admitting: Family Medicine

## 2017-07-28 ENCOUNTER — Encounter: Payer: Self-pay | Admitting: Obstetrics & Gynecology

## 2017-07-29 ENCOUNTER — Ambulatory Visit: Payer: Self-pay | Admitting: *Deleted

## 2017-07-29 NOTE — Telephone Encounter (Signed)
Patient lives in [place] at this time but wishes to remain with PCP. Phoned in with excessive vaginal bleeding for 6 days. She has been on Norethindron 10 MG since August 2018. She began spotting 3 weeks ago after not having a period since December 25th 2018. She stopped the Progestin 8 days ago thinking she needed to have a period since she had been spotting for almost 3 weeks. She had been off the Progestin for 2 days when her period started on Friday. Since Monday she has been going through a super tampon every hour along with a large thick pad every two hours (3 days now). She is taking a supplemental iron drink twice daily since then. She has an appointment with gyn next Tuesday. Advised UC for assessment and possible treatment. She will go to UC at [facility] on [address] at this time. She denies lightheadedness, dizziness. Reason for Disposition . MODERATE vaginal bleeding (i.e., soaking 1 pad or tampon per hour and present > 6 hours)  Answer Assessment - Initial Assessment Questions 1. AMOUNT: "Describe the bleeding that you are having."    - SPOTTING: spotting, or pinkish / brownish mucous discharge; does not fill panti-liner or pad    - MILD:  less than 1 pad / hour; less than patient's usual menstrual bleeding   - MODERATE: 1-2 pads / hour; small-medium blood clots (e.g., pea, grape, small coin)    - SEVERE: soaking 2 or more pads/hour for 2 or more hours; bleeding not contained by pads or continuous red blood from vagina; large blood clots (e.g., golf ball, large coin)      One super tampon every hour, goes thru to pad after an hour. Large thick pad is full in addition to super tampon every other hour.  Quarter-sized clots noticed since Monday. 2. ONSET: "When did the bleeding begin?" "Is it continuing now?"     Last Friday, 6 days ago.  3. MENSTRUAL PERIOD: "When was the last normal menstrual period?" "How is this different than your period?"     December 25th was  her last period. 4. REGULARITY: "How regular are your periods?"     Not regular. She is on Nore thindrom 10 MG daily to stop having periods. 5. ABDOMINAL PAIN: "Do you have any pain?" "How bad is the pain?"  (e.g., Scale 1-10; mild, moderate, or severe)   - MILD (1-3): doesn't interfere with normal activities, abdomen soft and not tender to touch    - MODERATE (4-7): interferes with normal activities or awakens from sleep, tender to touch    - SEVERE (8-10): excruciating pain, doubled over, unable to do any normal activities      Only on Friday the 1st day of bleeding, none since.  6. PREGNANCY: "Could you be pregnant?" "Are you sexually active?" "Did you recently give birth?"     no 7. BREASTFEEDING: "Are you breastfeeding?"     no 8. HORMONES: "Are you taking any hormone medications, prescription or OTC?" (e.g., birth control pills, estrogen)     Norethindron 10 MG daily from her gyn. 9. BLOOD THINNERS: "Do you take any blood thinners?" (e.g., Coumadin/warfarin, Pradaxa/dabigatran, aspirin)     no 10. CAUSE: "What do you think is causing the bleeding?" (e.g., recent gyn surgery, recent gyn procedure; known bleeding disorder, cervical cancer, polycystic ovarian disease, fibroids)         12 years ago dx with fibroids. Around 10 plus years ago, ultrasound showed fibroids had shrunk. 11. HEMODYNAMIC  STATUS: "Are you weak or feeling lightheaded?" If so, ask: "Can you stand and walk normally?"        No. Taking iron twice daily. 12. OTHER SYMPTOMS: "What other symptoms are you having with the bleeding?" (e.g., passed tissue, vaginal discharge, fever, menstrual-type cramps)       Yes passing clots and tissue.  Protocols used: VAGINAL BLEEDING - ABNORMAL-A-AH

## 2017-07-30 NOTE — Telephone Encounter (Signed)
Agree with advisement, thanks

## 2017-09-28 IMAGING — MG 2D DIGITAL SCREENING BILATERAL MAMMOGRAM WITH CAD AND ADJUNCT TO
8 of 12 series · 8 of 28 positions shown · non-contrast
Comparison: Previous exam(s).

CLINICAL DATA: Screening.

EXAM:
2D DIGITAL SCREENING BILATERAL MAMMOGRAM WITH CAD AND ADJUNCT TOMO

[R CC]
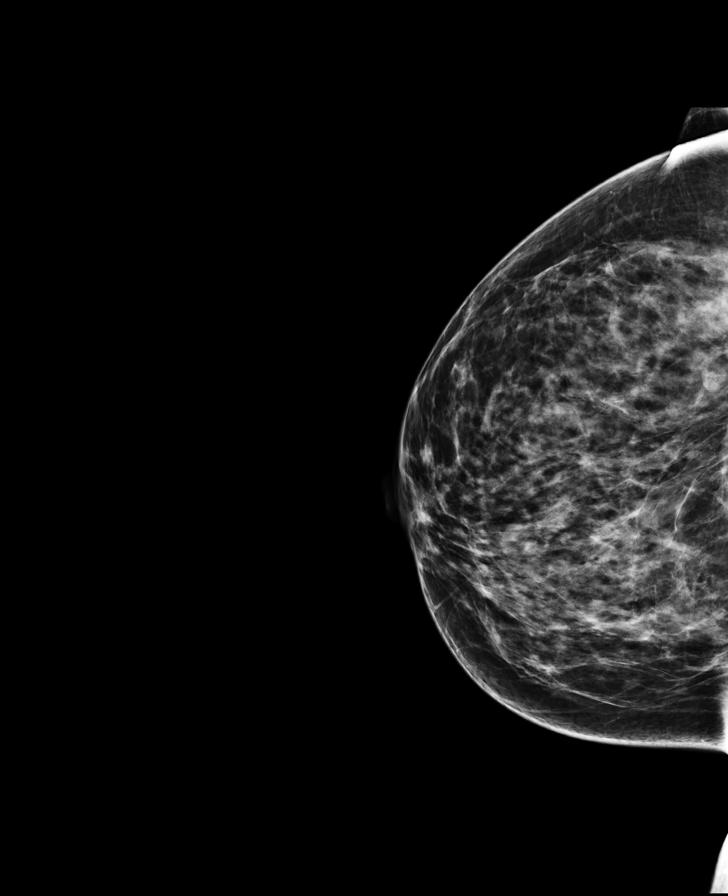

[L MLO]
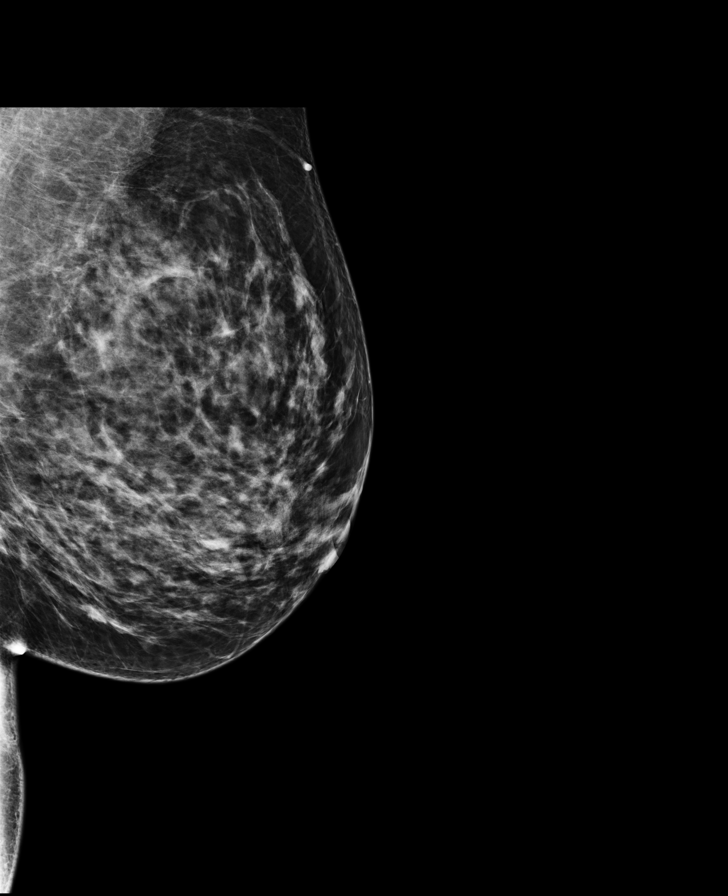

[R CC synth-2D]
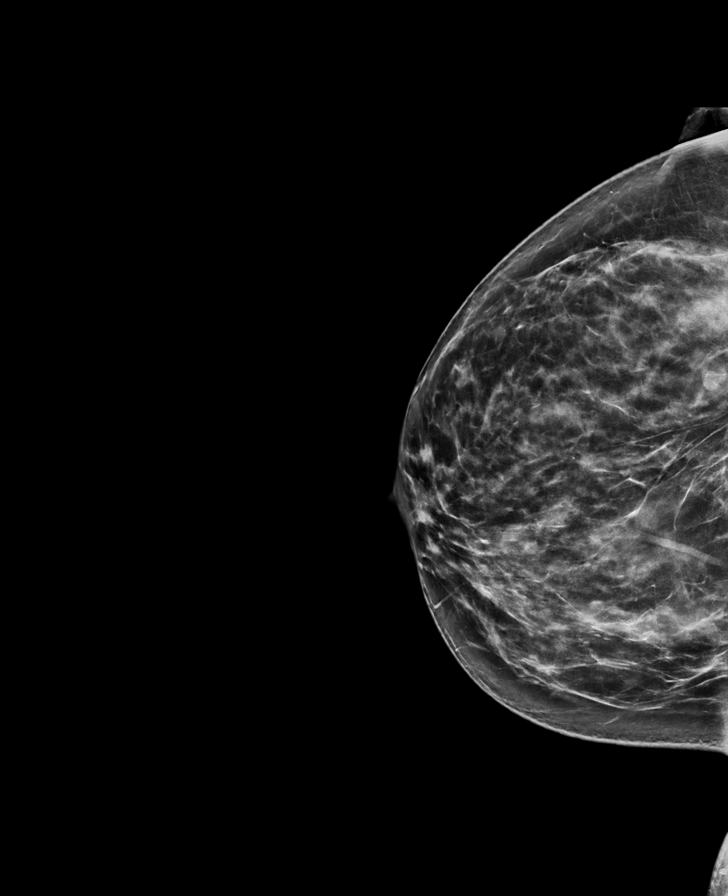

[R MLO synth-2D]
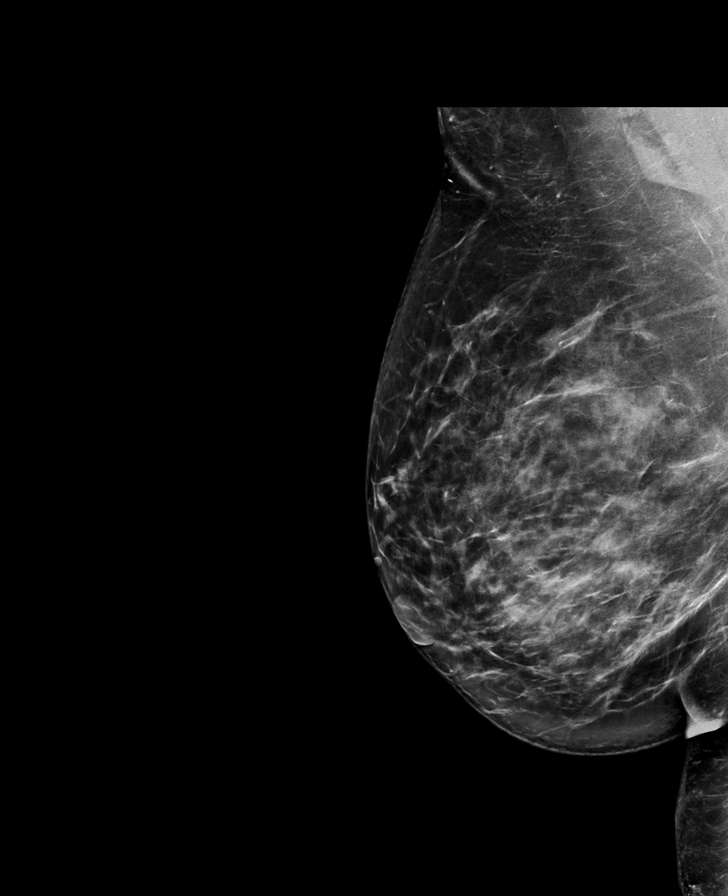

[R MLO]
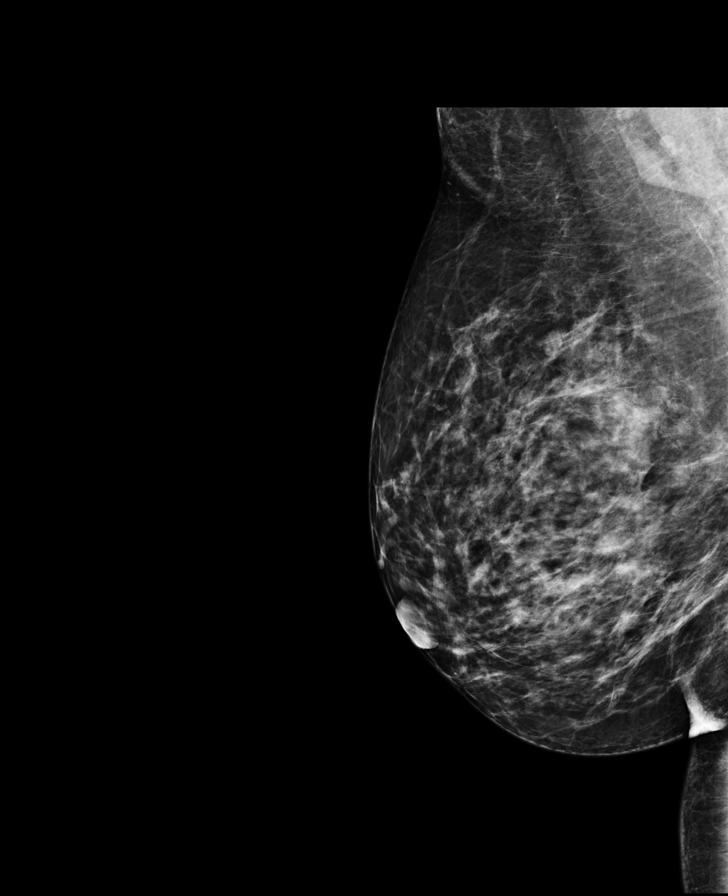

[L CC synth-2D]
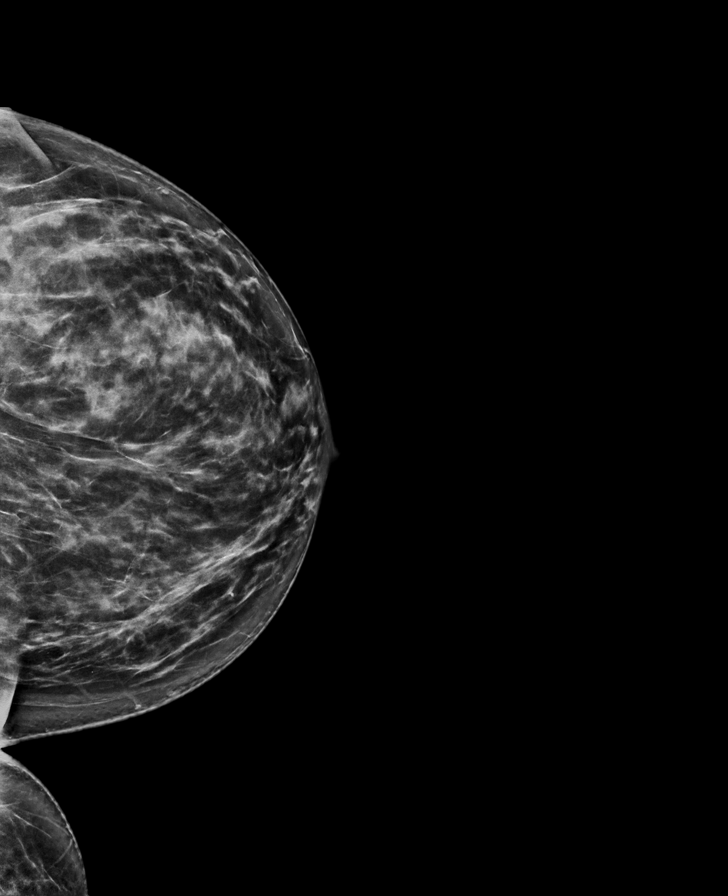

[L CC]
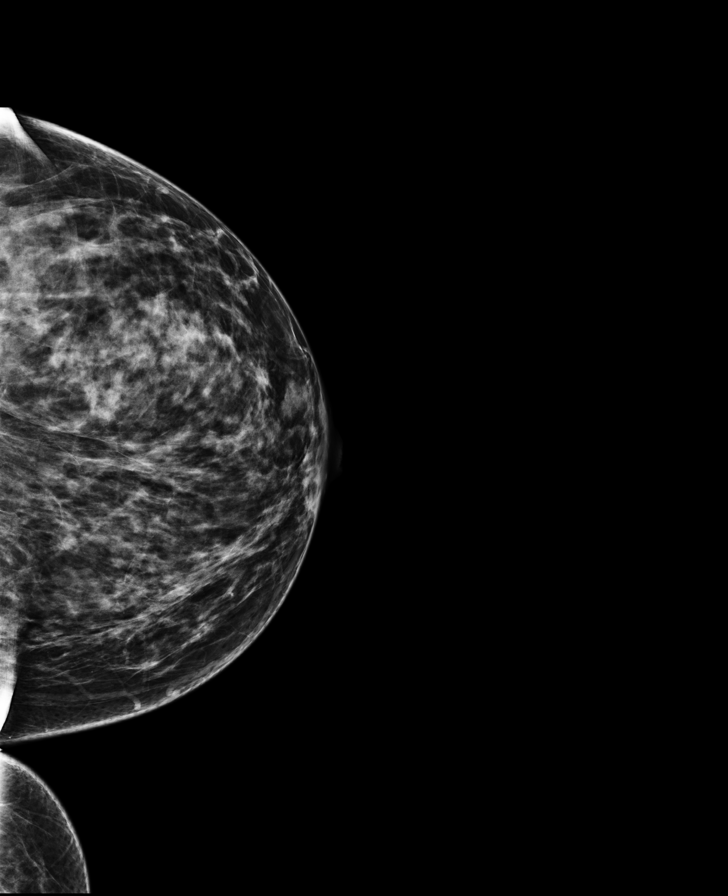

[L MLO synth-2D]
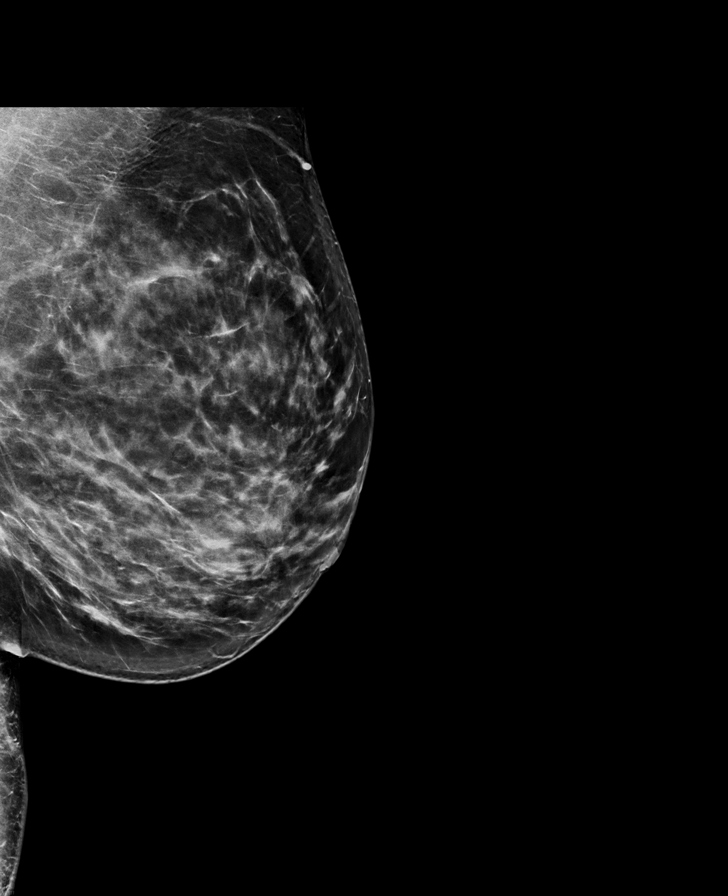

[8 of 28 positions shown; findings below may reference images not displayed]

ACR Breast Density Category c: The breast tissue is heterogeneously
dense, which may obscure small masses.
FINDINGS: There are no findings suspicious for malignancy. Images were
processed with CAD.
IMPRESSION: No mammographic evidence of malignancy. A result letter of this
screening mammogram will be mailed directly to the patient.

RECOMMENDATION:
Screening mammogram in one year. (Code:[id number])

BI-RADS CATEGORY  1: Negative.

## 2017-11-03 ENCOUNTER — Other Ambulatory Visit: Payer: Self-pay | Admitting: Family Medicine

## 2018-02-03 ENCOUNTER — Other Ambulatory Visit: Payer: Self-pay | Admitting: *Deleted

## 2018-02-03 NOTE — Telephone Encounter (Signed)
Last OV was 12/30/16 and no future appts, please advise

## 2018-02-03 NOTE — Telephone Encounter (Signed)
Please schedule f/u and refill until then  

## 2018-02-04 MED ORDER — AMLODIPINE BESYLATE 5 MG PO TABS: 5.0000 mg | ORAL_TABLET | Freq: Every day | ORAL | 0 refills | Status: DC

## 2018-02-04 NOTE — Telephone Encounter (Signed)
Patient notified bp medication refilled and she scheduled a f/u appointment on 02/18/18.

## 2018-02-18 ENCOUNTER — Ambulatory Visit: Payer: 59 | Admitting: Family Medicine

## 2018-05-04 ENCOUNTER — Telehealth: Payer: Self-pay | Admitting: Family Medicine

## 2018-05-04 NOTE — Telephone Encounter (Signed)
I left a detailed message on patient's voice mail to call back and schedule a Webex appointment with Dr.Tower to follow up on her Diabetes.

## 2018-05-11 ENCOUNTER — Other Ambulatory Visit: Payer: Self-pay | Admitting: Family Medicine

## 2018-05-14 ENCOUNTER — Other Ambulatory Visit: Payer: Self-pay | Admitting: Family Medicine

## 2019-01-03 ENCOUNTER — Other Ambulatory Visit: Payer: Self-pay

## 2019-01-03 ENCOUNTER — Encounter: Payer: Self-pay | Admitting: Family Medicine

## 2019-01-03 ENCOUNTER — Ambulatory Visit (INDEPENDENT_AMBULATORY_CARE_PROVIDER_SITE_OTHER): Payer: 59 | Admitting: Family Medicine

## 2019-01-03 VITALS — BP 136/90 | HR 106 | Temp 96.7°F | Ht 64.0 in | Wt 157.4 lb

## 2019-01-03 DIAGNOSIS — E559 Vitamin D deficiency, unspecified: Secondary | ICD-10-CM | POA: Diagnosis not present

## 2019-01-03 DIAGNOSIS — R7303 Prediabetes: Secondary | ICD-10-CM

## 2019-01-03 DIAGNOSIS — Z23 Encounter for immunization: Secondary | ICD-10-CM

## 2019-01-03 DIAGNOSIS — I10 Essential (primary) hypertension: Secondary | ICD-10-CM

## 2019-01-03 DIAGNOSIS — IMO0002 Reserved for concepts with insufficient information to code with codable children: Secondary | ICD-10-CM | POA: Insufficient documentation

## 2019-01-03 DIAGNOSIS — E7800 Pure hypercholesterolemia, unspecified: Secondary | ICD-10-CM | POA: Diagnosis not present

## 2019-01-03 DIAGNOSIS — E119 Type 2 diabetes mellitus without complications: Secondary | ICD-10-CM | POA: Diagnosis not present

## 2019-01-03 DIAGNOSIS — D500 Iron deficiency anemia secondary to blood loss (chronic): Secondary | ICD-10-CM | POA: Diagnosis not present

## 2019-01-03 DIAGNOSIS — D509 Iron deficiency anemia, unspecified: Secondary | ICD-10-CM

## 2019-01-03 DIAGNOSIS — E1165 Type 2 diabetes mellitus with hyperglycemia: Secondary | ICD-10-CM | POA: Insufficient documentation

## 2019-01-03 DIAGNOSIS — N926 Irregular menstruation, unspecified: Secondary | ICD-10-CM

## 2019-01-03 MED ORDER — LEVOCETIRIZINE DIHYDROCHLORIDE 5 MG PO TABS: 5.0000 mg | ORAL_TABLET | Freq: Every evening | ORAL | 3 refills | Status: AC

## 2019-01-03 MED ORDER — HYDROCHLOROTHIAZIDE 25 MG PO TABS: 25.0000 mg | ORAL_TABLET | Freq: Every day | ORAL | 3 refills | Status: AC

## 2019-01-03 MED ORDER — AMLODIPINE BESYLATE 5 MG PO TABS: 5.0000 mg | ORAL_TABLET | Freq: Every day | ORAL | 3 refills | Status: AC

## 2019-01-03 MED ORDER — FLUTICASONE PROPIONATE 50 MCG/ACT NA SUSP: 2.0000 | Freq: Every day | NASAL | 11 refills | Status: AC

## 2019-01-03 MED ORDER — MONTELUKAST SODIUM 10 MG PO TABS: 10.0000 mg | ORAL_TABLET | Freq: Every day | ORAL | 3 refills | Status: AC | PRN

## 2019-01-03 MED ORDER — LOSARTAN POTASSIUM 100 MG PO TABS: 100.0000 mg | ORAL_TABLET | Freq: Every day | ORAL | 3 refills | Status: AC

## 2019-01-03 NOTE — Patient Instructions (Signed)
Continue diabetic diet  Try to get most of your carbohydrates from produce (with the exception of white potatoes)  Eat less bread/pasta/rice/snack foods/cereals/sweets and other items from the middle of the grocery store (processed carbs)   Keep exercising  Labs today    Keep watching BP at home

## 2019-01-03 NOTE — Assessment & Plan Note (Signed)
bp in fair control at this time  BP Readings from Last 1 Encounters:  01/03/19 136/90   No changes needed Most recent labs reviewed  Disc lifstyle change with low sodium diet and exercise  Per pt- readings are good at home Labs today

## 2019-01-03 NOTE — Assessment & Plan Note (Signed)
Labs today  Diet is improving Disc goals for lipids and reasons to control them Rev last labs with pt Rev low sat fat diet in detail  Will disc starting statin in light of DM2 when labs return

## 2019-01-03 NOTE — Assessment & Plan Note (Signed)
Per pt last A1C with 7.5 Was lost to f/u now lives in [place]  No medications   A1C today  Enc low carb diet and exercise  Has done dm teaching  Given pna 23 On arb and willl need to start statin after labs return  Sent for last DM eye exam /spring

## 2019-01-03 NOTE — Progress Notes (Signed)
Subjective:    Patient ID: Ellen [name], female    DOB: [birth year], 48 y.o.   MRN: [id number]  HPI Pt presents for f/u of HTN   Lives in [place] now  Had very heavy bleeding and then got IUD- much better  She had regular cycles -now starting to spread out   Had a tummy tuck as well  She was really happy with it  Her digestive issues are gone    Wt Readings from Last 3 Encounters:  01/03/19 157 lb 6 oz (71.4 kg)  12/30/16 161 lb 12 oz (73.4 kg)  11/24/16 162 lb 3.2 oz (73.6 kg)  she lost and then gained some back  27.01 kg/m   At her last visit she took losartan hct 100-25 mg and we then added amlodipine 5 mg  Since that time there were manufacturing issues with losartan temporarily  bp is stable today  At home her bp is good - at highest 123456 systolic  No cp or palpitations or headaches or edema  No side effects to medicines  BP Readings from Last 3 Encounters:  01/03/19 136/90  12/30/16 (!) 160/98  11/24/16 (!) 150/106    Taking losartan 100  hctz 25 Amlodipine 5    Pulse Readings from Last 3 Encounters:  01/03/19 (!) 106  12/30/16 92  11/24/16 (!) 101  HR goes up in the doctor's office   H/o prediabetes Lab Results  Component Value Date   HGBA1C 8.5 (H) 12/30/2016  she saw a pcp in [place]  Lowered her wt and got it down to 7.5  Then gained 10 lb back  She occ checks her blood sugar  Had gestational DM twice so has done DM teaching twice    Lab Results  Component Value Date   CREATININE 1.08 12/30/2016   BUN 15 12/30/2016   NA 134 (L) 12/30/2016   K 3.6 12/30/2016   CL 97 12/30/2016   CO2 28 12/30/2016   Last cbc was July 2019  She was anemic-then started to improve with  Px iron from her gyn    Last eye exam march/april   Patient Active Problem List   Diagnosis Date Noted  . Iron deficiency anemia 01/03/2019  . Controlled type 2 diabetes mellitus without complication, without long-term current use of insulin ([place] [place]) 01/03/2019  .  Vitamin D deficiency 03/29/2016  . Irregular menses 12/04/2014  . Essential hypertension 07/16/2014  . Routine general medical examination at a health care facility 03/20/2014  . Anemia, iron deficiency 05/04/2012  . Seborrheic dermatitis 09/25/2011  . GANGLION CYST, WRIST, LEFT 03/03/2010  . COMMON MIGRAINE 06/22/2008  . DIABETES MELLITUS, GESTATIONAL, HX OF 02/15/2008  . Hyperlipidemia 10/01/2006  . Sinusitis, chronic 10/01/2006  . ALLERGIC RHINITIS 10/01/2006  . ALOPECIA 10/01/2006   Past Medical History:  Diagnosis Date  . Allergic rhinitis due to pollen   . DM (diabetes mellitus), gestational   . 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 Hyperlipidemia   . Hypertension   . Migraine   . Seborrheic dermatitis    Past Surgical History:  Procedure Laterality Date  . CESAREAN SECTION     x 2   . hystosalpingogram    . PILONIDAL CYST EXCISION    . UTERINE FIBROID SURGERY     Social History   Tobacco Use  . Smoking status: Never Smoker  . Smokeless tobacco: Never Used  Substance Use Topics  . Alcohol use: No    Alcohol/week:  0.0 standard drinks  . Drug use: No   Family History  Problem Relation Age of Onset  . Liver cancer Mother   . Diabetes Father   . Heart disease Father   . Migraines Sister   . Migraines Sister   . Migraines Sister   . Colon cancer Neg Hx    No Known Allergies Current Outpatient Medications on File Prior to Visit  Medication Sig Dispense Refill  . [employer] VITAMIN D3 1000 units CHEW CHEW 2 GUMMES (2000U) DAILY 180 tablet 1  . FeFum-FePo-FA-B Cmp-C-Zn-Mn-Cu (SE-TAN PLUS) 162-115.2-1 MG CAPS TAKE 1 CAPSULE BY MOUTH EVERY DAY 90 capsule 2  . ipratropium (ATROVENT) 0.06 % nasal spray Place 2 sprays into both nostrils 4 (four) times daily. (Patient taking differently: Place 2 sprays into both nostrils 2 (two) times daily as needed. ) 15 mL 1  . ondansetron (ZOFRAN-ODT) 8 MG disintegrating tablet Take 1 tablet (8 mg total) by mouth every 8  (eight) hours as needed for nausea or vomiting. 30 tablet 3   No current facility-administered medications on file prior to visit.     Review of Systems  Constitutional: Negative for activity change, appetite change, fatigue, fever and unexpected weight change.  HENT: Negative for congestion, ear pain, rhinorrhea, sinus pressure and sore throat.   Eyes: Negative for pain, redness and visual disturbance.  Respiratory: Negative for cough, shortness of breath and wheezing.   Cardiovascular: Negative for chest pain and palpitations.  Gastrointestinal: Negative for abdominal pain, blood in stool, constipation and diarrhea.  Endocrine: Negative for polydipsia and polyuria.  Genitourinary: Negative for dysuria, frequency and urgency.  Musculoskeletal: Negative for arthralgias, back pain and myalgias.  Skin: Negative for pallor and rash.  Allergic/Immunologic: Negative for environmental allergies.  Neurological: Negative for dizziness, syncope and headaches.  Hematological: Negative for adenopathy. Does not bruise/bleed easily.  Psychiatric/Behavioral: Negative for decreased concentration and dysphoric mood. The patient is not nervous/anxious.        Objective:   Physical Exam Constitutional:      General: She is not in acute distress.    Appearance: Normal appearance. She is well-developed and normal weight. She is not ill-appearing or diaphoretic.  HENT:     Head: Normocephalic and atraumatic.     Mouth/Throat:     Mouth: Mucous membranes are moist.  Eyes:     Conjunctiva/sclera: Conjunctivae normal.     Pupils: Pupils are equal, round, and reactive to light.  Neck:     Musculoskeletal: Normal range of motion and neck supple.     Thyroid: No thyromegaly.     Vascular: No carotid bruit or JVD.  Cardiovascular:     Rate and Rhythm: Regular rhythm. Tachycardia present.     Pulses: Normal pulses.     Heart sounds: Murmur present. No gallop.      Comments: Systolic M (sounds like flow  M) Pulmonary:     Effort: Pulmonary effort is normal. No respiratory distress.     Breath sounds: Normal breath sounds. No wheezing or rales.  Abdominal:     General: Bowel sounds are normal. There is no distension or abdominal bruit.     Palpations: Abdomen is soft. There is no mass.     Tenderness: There is no abdominal tenderness.  Lymphadenopathy:     Cervical: No cervical adenopathy.  Skin:    General: Skin is warm and dry.     Findings: No rash.     Comments: Skin tags on neck  Neurological:  Mental Status: She is alert. Mental status is at baseline.     Sensory: No sensory deficit.     Coordination: Coordination normal.     Deep Tendon Reflexes: Reflexes are normal and symmetric. Reflexes normal.  Psychiatric:        Mood and Affect: Mood normal.        Cognition and Memory: Cognition and memory normal.     Comments: Pleasant            Assessment & Plan:   Problem List Items Addressed This Visit      Cardiovascular and Mediastinum   Essential hypertension    bp in fair control at this time  BP Readings from Last 1 Encounters:  01/03/19 136/90   No changes needed Most recent labs reviewed  Disc lifstyle change with low sodium diet and exercise  Per pt- readings are good at home Labs today      Relevant Medications   amLODipine (NORVASC) 5 MG tablet   hydrochlorothiazide (HYDRODIURIL) 25 MG tablet   losartan (COZAAR) 100 MG tablet   Other Relevant Orders   CBC w/Diff   Comprehensive metabolic panel   Lipid panel   TSH     Endocrine   Controlled type 2 diabetes mellitus without complication, without long-term current use of insulin ([place]) - Primary    Per pt last A1C with 7.5 Was lost to f/u now lives in [place]  No medications   A1C today  Enc low carb diet and exercise  Has done dm teaching  Given pna 23 On arb and willl need to start statin after labs return  Sent for last DM eye exam /spring      Relevant Medications   losartan  (COZAAR) 100 MG tablet     Other   Hyperlipidemia    Labs today  Diet is improving Disc goals for lipids and reasons to control them Rev last labs with pt Rev low sat fat diet in detail  Will disc starting statin in light of DM2 when labs return      Relevant Medications   amLODipine (NORVASC) 5 MG tablet   hydrochlorothiazide (HYDRODIURIL) 25 MG tablet   losartan (COZAAR) 100 MG tablet   Other Relevant Orders   Lipid panel   Anemia, iron deficiency    Suspect from heavy menses before IUD Labs done today      Relevant Orders   CBC w/Diff   Ferritin   Irregular menses    Now doing better with IUD      Vitamin D deficiency    Pt takes otc D3 Level today      Relevant Orders   VITAMIN D 25 Hydroxy (Vit-D Deficiency, Fractures)   Iron deficiency anemia   Relevant Orders   CBC w/Diff   Ferritin   RESOLVED: Prediabetes   Relevant Orders   Hemoglobin A1c    Other Visit Diagnoses    Need for 23-polyvalent pneumococcal polysaccharide vaccine       Relevant Orders   Pneumococcal polysaccharide vaccine 23-valent greater than or equal to 2yo subcutaneous/IM (Completed)

## 2019-01-03 NOTE — Assessment & Plan Note (Signed)
Now doing better with IUD

## 2019-01-03 NOTE — Assessment & Plan Note (Signed)
Suspect from heavy menses before IUD Labs done today

## 2019-01-03 NOTE — Assessment & Plan Note (Signed)
Pt takes otc D3 Level today

## 2019-01-04 LAB — HEMOGLOBIN A1C: Hgb A1c MFr Bld: 10.4 % — ABNORMAL HIGH (ref 4.6–6.5)

## 2019-01-04 LAB — CBC WITH DIFFERENTIAL/PLATELET
Basophils Absolute: 0.1 10*3/uL (ref 0.0–0.1)
Basophils Relative: 0.9 % (ref 0.0–3.0)
Eosinophils Absolute: 0.1 10*3/uL (ref 0.0–0.7)
Eosinophils Relative: 1.3 % (ref 0.0–5.0)
HCT: 40.5 % (ref 36.0–46.0)
Hemoglobin: 12.8 g/dL (ref 12.0–15.0)
Lymphocytes Relative: 23.7 % (ref 12.0–46.0)
Lymphs Abs: 1.6 10*3/uL (ref 0.7–4.0)
MCHC: 31.7 g/dL (ref 30.0–36.0)
MCV: 80.3 fl (ref 78.0–100.0)
Monocytes Absolute: 0.5 10*3/uL (ref 0.1–1.0)
Monocytes Relative: 7.8 % (ref 3.0–12.0)
Neutro Abs: 4.4 10*3/uL (ref 1.4–7.7)
Neutrophils Relative %: 66.3 % (ref 43.0–77.0)
Platelets: 254 10*3/uL (ref 150.0–400.0)
RBC: 5.04 Mil/uL (ref 3.87–5.11)
RDW: 16.3 % — ABNORMAL HIGH (ref 11.5–15.5)
WBC: 6.6 10*3/uL (ref 4.0–10.5)

## 2019-01-04 LAB — TSH: TSH: 0.87 u[IU]/mL (ref 0.35–4.50)

## 2019-01-04 LAB — FERRITIN: Ferritin: 38.7 ng/mL (ref 10.0–291.0)

## 2019-01-04 LAB — LIPID PANEL
Cholesterol: 228 mg/dL — ABNORMAL HIGH (ref 0–200)
HDL: 53 mg/dL (ref >39.00)
NonHDL: 175.22
Total CHOL/HDL Ratio: 4
Triglycerides: 235 mg/dL — ABNORMAL HIGH (ref 0.0–149.0)
VLDL: 47 mg/dL — ABNORMAL HIGH (ref 0.0–40.0)

## 2019-01-04 LAB — COMPREHENSIVE METABOLIC PANEL
ALT: 9 U/L (ref 0–35)
AST: 12 U/L (ref 0–37)
Albumin: 4.4 g/dL (ref 3.5–5.2)
Alkaline Phosphatase: 102 U/L (ref 39–117)
BUN: 18 mg/dL (ref 6–23)
CO2: 28 mEq/L (ref 19–32)
Calcium: 9.5 mg/dL (ref 8.4–10.5)
Chloride: 95 mEq/L — ABNORMAL LOW (ref 96–112)
Creatinine, Ser: 1.19 mg/dL (ref 0.40–1.20)
GFR: 58.13 mL/min — ABNORMAL LOW (ref >60.00)
Glucose, Bld: 422 mg/dL — ABNORMAL HIGH (ref 70–99)
Potassium: 3.9 mEq/L (ref 3.5–5.1)
Sodium: 134 mEq/L — ABNORMAL LOW (ref 135–145)
Total Bilirubin: 0.6 mg/dL (ref 0.2–1.2)
Total Protein: 7.5 g/dL (ref 6.0–8.3)

## 2019-01-04 LAB — LDL CHOLESTEROL, DIRECT: Direct LDL: 151 mg/dL

## 2019-01-04 LAB — VITAMIN D 25 HYDROXY (VIT D DEFICIENCY, FRACTURES): VITD: 47.17 ng/mL (ref 30.00–100.00)

## 2019-01-05 ENCOUNTER — Telehealth: Payer: Self-pay | Admitting: *Deleted

## 2019-01-05 DIAGNOSIS — E1165 Type 2 diabetes mellitus with hyperglycemia: Secondary | ICD-10-CM

## 2019-01-05 NOTE — Telephone Encounter (Signed)
Left VM requesting pt to call the office back regarding lab results  

## 2019-01-10 NOTE — Telephone Encounter (Signed)
Patient returned phone call regarding labs. She states that she wants to hold off on taking a statin medication at this time. She states that she is going to try to lower her cholesterol naturally.  Patient states that she is also ok with an endocrinology referral. I will route to Dr. Glori Bickers.   Thanks!

## 2019-01-10 NOTE — Telephone Encounter (Signed)
Referral done  Will send to [facility] 

## 2019-01-12 NOTE — Telephone Encounter (Deleted)
-----   Message from Abner Greenspan, MD sent at 01/04/2019  9:16 PM EST ----- Released on mychart  She needs endocrinology ref for diabetes- is she agreeable?  Also please send in crestor 10 mg 1 po QD in evening #30 [phone] ref  We need to re check lipid/ast/alt in 6-8 weeks please

## 2019-03-02 ENCOUNTER — Encounter: Payer: Self-pay | Admitting: Family Medicine

## 2019-03-06 ENCOUNTER — Telehealth: Payer: Self-pay | Admitting: Family Medicine

## 2019-03-06 DIAGNOSIS — E1169 Type 2 diabetes mellitus with other specified complication: Secondary | ICD-10-CM

## 2019-03-06 DIAGNOSIS — E785 Hyperlipidemia, unspecified: Secondary | ICD-10-CM

## 2019-03-06 DIAGNOSIS — L219 Seborrheic dermatitis, unspecified: Secondary | ICD-10-CM

## 2019-03-06 DIAGNOSIS — I10 Essential (primary) hypertension: Secondary | ICD-10-CM

## 2019-03-06 NOTE — Telephone Encounter (Signed)
-----   Message from Cloyd Stagers, RT sent at 02/24/2019  1:40 PM EST ----- Regarding: Lab Orders forTuesday 2.2.2021 Please place lab orders for Tuesday 2.2.2021, appt notes state "f/u labs" Thank you, Dyke Maes RT(R)

## 2019-03-07 ENCOUNTER — Other Ambulatory Visit: Payer: 59

## 2019-08-17 ENCOUNTER — Encounter: Payer: Self-pay | Admitting: Family Medicine

## 2020-01-14 ENCOUNTER — Other Ambulatory Visit: Payer: Self-pay | Admitting: Family Medicine

## 2020-01-15 NOTE — Telephone Encounter (Signed)
I spoke to patient and she said she's working and it's harder for her to come and see Dr.Tower. Patient said she's going to try to find a doctor in [place]. She wanted Dr.Tower to know she appreciates all that she's done for her.

## 2020-01-15 NOTE — Telephone Encounter (Signed)
I think she lives in [place] now and unsure if she plans to return or find someone there  Set up PE or f/u if she stays with [place] and refill until then Thanks

## 2020-01-15 NOTE — Telephone Encounter (Signed)
Pt hasn't been seen in over a year and no future appts., please advise  

## 2020-09-09 ENCOUNTER — Other Ambulatory Visit: Payer: Self-pay | Admitting: Family Medicine

## 2020-09-11 NOTE — Telephone Encounter (Signed)
Pt moved to [place], ?? If she has a new PCP there last OV was in 2020, please call pt and schedule a CPE or med refill f/u with pt before I can fill meds. Once appt has been made please rote back to me to fill med.   If pt has new PCP in [place] please still route back to me to decline meds. thanks

## 2020-09-12 NOTE — Telephone Encounter (Signed)
Called pt and she stated that she has moved to [place] and has a new PCP. Her meds have been refilled by the new provider.

## 2020-09-12 NOTE — Telephone Encounter (Signed)
Meds declined
